# Patient Record
Sex: Male | Born: 1945
Health system: Southern US, Community
[De-identification: ages and names within clinical notes are randomized; demographics above are authoritative.]

## PROBLEM LIST (undated history)

## (undated) DIAGNOSIS — K589 Irritable bowel syndrome without diarrhea: Secondary | ICD-10-CM

## (undated) DIAGNOSIS — Z9889 Other specified postprocedural states: Secondary | ICD-10-CM

## (undated) DIAGNOSIS — K4020 Bilateral inguinal hernia, without obstruction or gangrene, not specified as recurrent: Secondary | ICD-10-CM

## (undated) DIAGNOSIS — Z9852 Vasectomy status: Secondary | ICD-10-CM

## (undated) DIAGNOSIS — K52831 Collagenous colitis: Secondary | ICD-10-CM

## (undated) DIAGNOSIS — Z9079 Acquired absence of other genital organ(s): Secondary | ICD-10-CM

## (undated) DIAGNOSIS — C4492 Squamous cell carcinoma of skin, unspecified: Secondary | ICD-10-CM

## (undated) DIAGNOSIS — N400 Enlarged prostate without lower urinary tract symptoms: Secondary | ICD-10-CM

## (undated) DIAGNOSIS — R972 Elevated prostate specific antigen [PSA]: Secondary | ICD-10-CM

## (undated) DIAGNOSIS — N200 Calculus of kidney: Secondary | ICD-10-CM

## (undated) DIAGNOSIS — N419 Inflammatory disease of prostate, unspecified: Secondary | ICD-10-CM

## (undated) DIAGNOSIS — K219 Gastro-esophageal reflux disease without esophagitis: Secondary | ICD-10-CM

## (undated) HISTORY — DX: Inflammatory disease of prostate, unspecified: N41.9

## (undated) HISTORY — DX: Acquired absence of other genital organ(s): Z90.79

## (undated) HISTORY — DX: Bilateral inguinal hernia, without obstruction or gangrene, not specified as recurrent: K40.20

## (undated) HISTORY — DX: Collagenous colitis: K52.831

## (undated) HISTORY — DX: Gastro-esophageal reflux disease without esophagitis: K21.9

## (undated) HISTORY — DX: Vasectomy status: Z98.52

## (undated) HISTORY — DX: Elevated prostate specific antigen (PSA): R97.20

## (undated) HISTORY — DX: Other specified postprocedural states: Z98.890

## (undated) HISTORY — DX: Calculus of kidney: N20.0

## (undated) HISTORY — DX: Squamous cell carcinoma of skin, unspecified: C44.92

## (undated) HISTORY — DX: Irritable bowel syndrome without diarrhea: K58.9

## (undated) HISTORY — DX: Benign prostatic hyperplasia without lower urinary tract symptoms: N40.0

---

## 1982-12-11 DIAGNOSIS — Z9852 Vasectomy status: Secondary | ICD-10-CM

## 1982-12-11 HISTORY — DX: Vasectomy status: Z98.52

## 2004-01-04 ENCOUNTER — Ambulatory Visit (HOSPITAL_COMMUNITY): Admission: RE | Admit: 2004-01-04 | Discharge: 2004-01-04 | Payer: Self-pay | Admitting: Gastroenterology

## 2004-01-04 ENCOUNTER — Encounter (INDEPENDENT_AMBULATORY_CARE_PROVIDER_SITE_OTHER): Payer: Self-pay | Admitting: *Deleted

## 2008-08-28 ENCOUNTER — Emergency Department (HOSPITAL_COMMUNITY): Admission: EM | Admit: 2008-08-28 | Discharge: 2008-08-29 | Payer: Self-pay | Admitting: Emergency Medicine

## 2010-09-01 ENCOUNTER — Ambulatory Visit (HOSPITAL_COMMUNITY): Admission: RE | Admit: 2010-09-01 | Discharge: 2010-09-01 | Payer: Self-pay | Admitting: Surgery

## 2011-02-23 LAB — SURGICAL PCR SCREEN: Staphylococcus aureus: NEGATIVE

## 2011-04-28 NOTE — Op Note (Signed)
NAME:  AMELIA, MACKEN                     ACCOUNT NO.:  [id number]   MEDICAL RECORD NO.:  [id number]                   PATIENT TYPE:  AMB   LOCATION:  ENDO                                 FACILITY:  [facility]   PHYSICIAN:  Anselmo Rod, M.D.               DATE OF BIRTH:  1946-07-30   DATE OF PROCEDURE:  01/04/2004  DATE OF DISCHARGE:                                 OPERATIVE REPORT   PROCEDURE PERFORMED:  Colonoscopy with biopsy times one.   ENDOSCOPIST:  Charna Elizabeth, M.D.   INSTRUMENT USED:  Olympus video colonoscope.   INDICATIONS FOR PROCEDURE:  The patient is a 65 year old white male with a  history of occasional bright red blood per rectum.  Rule out colonic polyps,  masses, hemorrhoids, etc.   PREPROCEDURE PREPARATION:  Informed consent was procured from the patient.  The patient was fasted for eight hours prior to the procedure and prepped  with a bottle of magnesium citrate and a gallon of GoLYTELY the night prior  to the procedure.   PREPROCEDURE PHYSICAL:  The patient had stable vital signs.  Neck supple.  Chest clear to auscultation.  S1 and S2 regular.  Abdomen soft with normal  bowel sounds.   DESCRIPTION OF PROCEDURE:  The patient was placed in left lateral decubitus  position and sedated with 60 mg of Demerol and 6 mg of Versed intravenously.  Once the patient was adequately sedated and maintained on low flow oxygen  and continuous cardiac monitoring, the Olympus video colonoscope was  advanced from the rectum to the cecum.  There was some residual stool in the  right side of the colon and multiple washes were done.  The patient's  position was changed from the left lateral to the supine and to the right  lateral position with gentle application of abdominal pressure to reach the  cecum.  The appendicular orifice and ileocecal valve were clearly visualized  and photographed.  A small erythematous patch was biopsied from the rectum  at 8 cm.  Retroflexion in  the rectum revealed small internal hemorrhoids.  No other abnormalities were noted.  The patient tolerated the procedure well  without complication.   IMPRESSION:  1. Essentially normal colonoscopy up to the cecum except for small internal     hemorrhoids.  2. Small red patch biopsied at 8 cm.  3. No masses, polyps or diverticulosis seen.   RECOMMENDATIONS:  1. Await pathology results.  2. Repeat colorectal cancer screening depending on pathology results.  3. Avoid all nonsteroidals for now.  4. Outpatient followup as need arises in the future.  5. Continue high fiber diet with liberal fluid intake.                                               Jyothi Nat  Loreta Ave, M.D.    JNM/MEDQ  D:  01/04/2004  T:  01/04/2004  Job:  [id number]   cc:   Marjory Lies, M.D.  [address]  [place]  [place] [address]  Fax: [phone]

## 2011-09-11 LAB — CBC
MCHC: 32.9
Platelets: 146 — ABNORMAL LOW
RBC: 5.23
RDW: 17 — ABNORMAL HIGH

## 2011-09-11 LAB — URINALYSIS, ROUTINE W REFLEX MICROSCOPIC
Nitrite: POSITIVE — AB
Protein, ur: 30 — AB
Specific Gravity, Urine: 1.019
Urobilinogen, UA: 0.2

## 2011-09-11 LAB — DIFFERENTIAL
Basophils Absolute: 0
Basophils Relative: 0
Monocytes Relative: 4
Neutro Abs: 15.7 — ABNORMAL HIGH
Neutrophils Relative %: 94 — ABNORMAL HIGH

## 2011-09-11 LAB — URINE MICROSCOPIC-ADD ON

## 2011-09-11 LAB — POCT I-STAT, CHEM 8
BUN: 20
Chloride: 105
HCT: 42
Sodium: 138

## 2011-09-11 LAB — URINE CULTURE

## 2013-12-11 DIAGNOSIS — Z9889 Other specified postprocedural states: Secondary | ICD-10-CM

## 2013-12-11 HISTORY — DX: Other specified postprocedural states: Z98.890

## 2014-05-29 LAB — HM COLONOSCOPY

## 2014-12-11 DIAGNOSIS — Z9079 Acquired absence of other genital organ(s): Secondary | ICD-10-CM

## 2014-12-11 HISTORY — DX: Acquired absence of other genital organ(s): Z90.79

## 2015-08-10 LAB — PSA: PSA: 11.57

## 2015-09-16 DIAGNOSIS — R69 Illness, unspecified: Secondary | ICD-10-CM | POA: Diagnosis not present

## 2015-09-16 DIAGNOSIS — Z0000 Encounter for general adult medical examination without abnormal findings: Secondary | ICD-10-CM | POA: Diagnosis not present

## 2015-09-16 DIAGNOSIS — R972 Elevated prostate specific antigen [PSA]: Secondary | ICD-10-CM | POA: Diagnosis not present

## 2015-09-21 DIAGNOSIS — R972 Elevated prostate specific antigen [PSA]: Secondary | ICD-10-CM | POA: Diagnosis not present

## 2015-10-07 DIAGNOSIS — N400 Enlarged prostate without lower urinary tract symptoms: Secondary | ICD-10-CM | POA: Diagnosis not present

## 2015-10-11 DIAGNOSIS — R197 Diarrhea, unspecified: Secondary | ICD-10-CM | POA: Diagnosis not present

## 2015-10-14 DIAGNOSIS — N3289 Other specified disorders of bladder: Secondary | ICD-10-CM | POA: Diagnosis not present

## 2015-10-14 DIAGNOSIS — R3914 Feeling of incomplete bladder emptying: Secondary | ICD-10-CM | POA: Diagnosis not present

## 2015-10-14 DIAGNOSIS — C61 Malignant neoplasm of prostate: Secondary | ICD-10-CM | POA: Diagnosis not present

## 2015-10-14 DIAGNOSIS — N401 Enlarged prostate with lower urinary tract symptoms: Secondary | ICD-10-CM | POA: Diagnosis not present

## 2015-10-14 DIAGNOSIS — R69 Illness, unspecified: Secondary | ICD-10-CM | POA: Diagnosis not present

## 2015-10-14 DIAGNOSIS — R339 Retention of urine, unspecified: Secondary | ICD-10-CM | POA: Diagnosis not present

## 2015-10-14 DIAGNOSIS — N359 Urethral stricture, unspecified: Secondary | ICD-10-CM | POA: Diagnosis not present

## 2015-10-14 DIAGNOSIS — N400 Enlarged prostate without lower urinary tract symptoms: Secondary | ICD-10-CM | POA: Diagnosis not present

## 2015-10-14 DIAGNOSIS — N323 Diverticulum of bladder: Secondary | ICD-10-CM | POA: Diagnosis not present

## 2015-10-14 DIAGNOSIS — Z79899 Other long term (current) drug therapy: Secondary | ICD-10-CM | POA: Diagnosis not present

## 2015-10-14 DIAGNOSIS — Z9079 Acquired absence of other genital organ(s): Secondary | ICD-10-CM | POA: Diagnosis not present

## 2015-10-14 DIAGNOSIS — Z9889 Other specified postprocedural states: Secondary | ICD-10-CM | POA: Diagnosis not present

## 2015-10-14 DIAGNOSIS — R197 Diarrhea, unspecified: Secondary | ICD-10-CM | POA: Diagnosis not present

## 2015-11-02 DIAGNOSIS — R972 Elevated prostate specific antigen [PSA]: Secondary | ICD-10-CM | POA: Diagnosis not present

## 2015-11-02 DIAGNOSIS — R8271 Bacteriuria: Secondary | ICD-10-CM | POA: Diagnosis not present

## 2016-01-14 DIAGNOSIS — X32XXXD Exposure to sunlight, subsequent encounter: Secondary | ICD-10-CM | POA: Diagnosis not present

## 2016-01-14 DIAGNOSIS — L570 Actinic keratosis: Secondary | ICD-10-CM | POA: Diagnosis not present

## 2016-01-14 DIAGNOSIS — B351 Tinea unguium: Secondary | ICD-10-CM | POA: Diagnosis not present

## 2016-04-13 DIAGNOSIS — R69 Illness, unspecified: Secondary | ICD-10-CM | POA: Diagnosis not present

## 2016-07-07 ENCOUNTER — Encounter: Payer: Self-pay | Admitting: Internal Medicine

## 2016-07-07 ENCOUNTER — Ambulatory Visit (INDEPENDENT_AMBULATORY_CARE_PROVIDER_SITE_OTHER): Payer: Medicare HMO | Admitting: Internal Medicine

## 2016-07-07 VITALS — BP 132/78 | HR 50 | Temp 97.6°F | Ht 73.0 in | Wt 181.0 lb

## 2016-07-07 DIAGNOSIS — N400 Enlarged prostate without lower urinary tract symptoms: Secondary | ICD-10-CM

## 2016-07-07 DIAGNOSIS — F411 Generalized anxiety disorder: Secondary | ICD-10-CM | POA: Diagnosis not present

## 2016-07-07 DIAGNOSIS — M4120 Other idiopathic scoliosis, site unspecified: Secondary | ICD-10-CM | POA: Diagnosis not present

## 2016-07-07 DIAGNOSIS — R251 Tremor, unspecified: Secondary | ICD-10-CM

## 2016-07-07 DIAGNOSIS — R972 Elevated prostate specific antigen [PSA]: Secondary | ICD-10-CM

## 2016-07-07 DIAGNOSIS — R69 Illness, unspecified: Secondary | ICD-10-CM | POA: Diagnosis not present

## 2016-07-07 DIAGNOSIS — K52831 Collagenous colitis: Secondary | ICD-10-CM | POA: Diagnosis not present

## 2016-07-07 DIAGNOSIS — R252 Cramp and spasm: Secondary | ICD-10-CM

## 2016-07-07 MED ORDER — ZOSTER VACCINE LIVE 19400 UNT/0.65ML ~~LOC~~ SUSR: 0.6500 mL | Freq: Once | SUBCUTANEOUS | 0 refills | Status: AC

## 2016-07-07 MED ORDER — BUDESONIDE 3 MG PO CPEP: 9.0000 mg | ORAL_CAPSULE | Freq: Every day | ORAL | 6 refills | Status: DC

## 2016-07-07 MED ORDER — TETANUS-DIPHTH-ACELL PERTUSSIS 5-2.5-18.5 LF-MCG/0.5 IM SUSP
0.5000 mL | Freq: Once | INTRAMUSCULAR | 0 refills | Status: AC
Start: 2016-07-07 — End: 2016-07-07

## 2016-07-07 NOTE — Patient Instructions (Addendum)
Continue current medications as ordered  Recommend follow up with urology Dr Thomasene Mohair for management of prostate  Follow up in 1-22mos for CPE/ECG/MMSE. Fasting labs prior to appt

## 2016-07-07 NOTE — Progress Notes (Signed)
Patient ID: Bob Larson, male   DOB: 03/02/1946, 70 y.o.   MRN: [id number]    Location:  [facility] Place of Service: OFFICE    Advanced Directive information Does patient have an advance directive?: No, Would patient like information on creating an advanced directive?: No - patient declined information  Chief Complaint  Patient presents with  . Establish Care    new patient to establish care    HPI:  70 yo male seen today as a new pt. His previous PCP is retiring soon. He has intermittent cramping in fingers x 2 mos, unrelated to activity level; has associated numbness in fingers. He is retired from Press photographer and property tax.   BPH/elevated PSA - underwent TURP in Nov 2016 with path results revealing 20% adenoCA. He decided to not to pursue additional mx and has not been back to urology Dr Thomasene Mohair  Collagenous colits - dx in Jun 2015 after colonoscopy bx. No dysplasia or malignancy noted on path report. He was Rx prednisone that caused diarrhea which improved after TURP. In March 2017, diarrhea returned and is using prn budesonide po daily which helps. Has taken pepto bismul and imodium in the past. Has seen Dr Collene Mares  He is taking at least 50 herbal supplements daily.   Anxiety/insomnia - takes 1/2 tab clonazepam qPM.   onychomycosis - applying topical terbinafine (oral caused forced BM with urination)  Past Medical History:  Diagnosis Date  . BPH (benign prostatic hyperplasia)   . Cancer of skin, squamous cell    located on head  . Collagenous colitis   . Elevated PSA   . GERD (gastroesophageal reflux disease)   . H/O colonoscopy 2015   Dr. Collene Mares  . Hernia, inguinal, bilateral   . Hx of vasectomy 1984   Dr. Joelyn Oms  . IBS (irritable bowel syndrome)   . Nephrolith   . Prostatitis   . S/P TURP (status post transurethral resection of prostate) 2016   Dr. Thomasene Mohair    History reviewed. No pertinent surgical history.  Patient Care Team: Gildardo Cranker, DO as PCP -  General (Internal Medicine) Juanita Craver, MD as Consulting Physician (Gastroenterology) Allyn Kenner, MD as Consulting Physician (Dermatology) Rutherford Guys, MD as Consulting Physician (Ophthalmology) Kerry Fort, MD as Consulting Physician (Urology)  Social History   Social History  . Marital status: Married    Spouse name: N/A  . Number of children: N/A  . Years of education: N/A   Occupational History  . Not on file.   Social History Main Topics  . Smoking status: Never Smoker  . Smokeless tobacco: Never Used  . Alcohol use No  . Drug use: No  . Sexual activity: Not on file   Other Topics Concern  . Not on file   Social History Narrative   Diet?  Normal      Do you drink/eat things with caffeine? Yes      Marital status?            M                        What year were you married? 1979      Do you live in a house, apartment, assisted living, condo, trailer, etc.? House      Is it one or more stories? One      How many persons live in your home? 2      Do you have any pets in  your home? (please list) no      Current or past profession: Education officer, museum, Property tax accounting      Do you exercise?          yes                            Type & how often? Bicycling every 4th day      Do you have a living will? no      Do you have a DNR form?      no                            If not, do you want to discuss one? no      Do you have signed POA/HPOA for forms? no           reports that he has never smoked. He has never used smokeless tobacco. He reports that he does not drink alcohol or use drugs.  Family History  Problem Relation Age of Onset  . AAA (abdominal aortic aneurysm) Father 14  . Pneumonia Mother 22  . Heart attack Brother 18   Family Status  Relation Status  . Father Deceased at age 4   AAA  . Mother Deceased at age 30   Pneumonia  . Brother Alive  . Brother Deceased at age 38   Heart Attack  . Daughter Alive  . Daughter Alive     Immunization History  Administered Date(s) Administered  . Tdap 01/09/2005    Allergies  Allergen Reactions  . Penicillins     Medications: Patient's Medications  New Prescriptions   No medications on file  Previous Medications   BUDESONIDE (ENTOCORT EC) 3 MG 24 HR CAPSULE    Take 3 mg by mouth as needed.   CLONAZEPAM (KLONOPIN) 1 MG TABLET    Take 1/2 to 1 tablet by mouth 3 times daily as needed   TERBINAFINE EX    Apply 1,000 mg topically at bedtime.  Modified Medications   No medications on file  Discontinued Medications   BUDESONIDE (ENTOCORT EC) 3 MG 24 HR CAPSULE    Take 9 mg by mouth daily.   CLONAZEPAM (KLONOPIN) 0.5 MG TABLET    Take 0.5 mg by mouth 2 (two) times daily as needed for anxiety.    Review of Systems  Gastrointestinal: Positive for diarrhea.  Musculoskeletal: Positive for arthralgias.  Neurological: Positive for tremors.  Psychiatric/Behavioral: The patient is nervous/anxious.   All other systems reviewed and are negative.   Vitals:   07/07/16 0840  BP: 132/78  Pulse: (!) 50  Temp: 97.6 F (36.4 C)  TempSrc: Oral  SpO2: 98%  Weight: 181 lb (82.1 kg)  Height: 6\' 1"  (1.854 m)   Body mass index is 23.88 kg/m.  Physical Exam  Constitutional: He is oriented to person, place, and time. He appears well-developed and well-nourished.  HENT:  Mouth/Throat: Oropharynx is clear and moist.  Eyes: Pupils are equal, round, and reactive to light. No scleral icterus.  Neck: Neck supple. Carotid bruit is not present.  Cardiovascular: Normal rate, regular rhythm, normal heart sounds and intact distal pulses.  Exam reveals no gallop and no friction rub.   No murmur heard. no distal LE swelling. No calf TTP  Pulmonary/Chest: Effort normal and breath sounds normal. He has no wheezes. He has no rales. He exhibits no tenderness.  Abdominal:  Soft. Bowel sounds are normal. He exhibits no distension, no abdominal bruit, no pulsatile midline mass and no mass.  There is no hepatomegaly. There is no tenderness. There is no rebound and no guarding.  Musculoskeletal: He exhibits edema (min MCP and PIP joint swelling).  Neg Tinel's  Lymphadenopathy:    He has no cervical adenopathy.  Neurological: He is alert and oriented to person, place, and time. He has normal reflexes. He displays tremor (fine resting).  Skin: Skin is warm and dry. No rash noted.  Psychiatric: He has a normal mood and affect. His behavior is normal. Judgment and thought content normal.     Labs reviewed: Abstract on 06/06/2016  Component Date Value Ref Range Status  . HM Colonoscopy 05/29/2014 See Report  See Report, Patient Reported Normal Final  . PSA 08/10/2015 11.57   Final    No results found.   Assessment/Plan   ICD-9-CM ICD-10-CM   1. Hand cramps - probably due to arthritis 729.82 R25.2   2. Idiopathic scoliosis 737.30 M41.20   3. Collagenous colitis 558.9 K52.831 budesonide (ENTOCORT EC) 3 MG 24 hr capsule  4. Tremor of both hands - new;likely benign essential 781.0 R25.1   5. Anxiety state 300.00 F41.1   6. BPH with elevated PSA 600.00 N40.0    790.93 R97.20    Check fasting cmp, lipid panel, tsh and cbc w diff  Observation for tremors  F/u with GI as indicated  F/u with urology for PSA mx  Cont current meds as ordered  F/u in 1-2 mos for CPE/ECG/MMSE  Lillyrose Reitan S. Perlie Gold  [facility] [address] [place], [place] [address] [phone] Cell (Monday-Friday 8 AM - 5 PM) [phone] After 5 PM and follow prompts

## 2016-07-21 ENCOUNTER — Encounter: Payer: Self-pay | Admitting: Internal Medicine

## 2016-07-25 ENCOUNTER — Encounter: Payer: Self-pay | Admitting: Internal Medicine

## 2016-08-03 ENCOUNTER — Other Ambulatory Visit: Payer: Self-pay

## 2016-08-03 DIAGNOSIS — Z0000 Encounter for general adult medical examination without abnormal findings: Secondary | ICD-10-CM

## 2016-08-11 DIAGNOSIS — R972 Elevated prostate specific antigen [PSA]: Secondary | ICD-10-CM | POA: Diagnosis not present

## 2016-08-18 DIAGNOSIS — R972 Elevated prostate specific antigen [PSA]: Secondary | ICD-10-CM | POA: Diagnosis not present

## 2016-08-18 DIAGNOSIS — R339 Retention of urine, unspecified: Secondary | ICD-10-CM | POA: Diagnosis not present

## 2016-08-18 DIAGNOSIS — C61 Malignant neoplasm of prostate: Secondary | ICD-10-CM | POA: Diagnosis not present

## 2016-08-18 DIAGNOSIS — N138 Other obstructive and reflux uropathy: Secondary | ICD-10-CM | POA: Diagnosis not present

## 2016-08-18 DIAGNOSIS — N401 Enlarged prostate with lower urinary tract symptoms: Secondary | ICD-10-CM | POA: Diagnosis not present

## 2016-09-11 ENCOUNTER — Other Ambulatory Visit: Payer: Medicare HMO

## 2016-09-11 DIAGNOSIS — Z0000 Encounter for general adult medical examination without abnormal findings: Secondary | ICD-10-CM

## 2016-09-11 DIAGNOSIS — Z136 Encounter for screening for cardiovascular disorders: Secondary | ICD-10-CM | POA: Diagnosis not present

## 2016-09-11 LAB — COMPLETE METABOLIC PANEL WITH GFR
ALBUMIN: 4.1 g/dL (ref 3.6–5.1)
ALK PHOS: 47 U/L (ref 40–115)
ALT: 16 U/L (ref 9–46)
AST: 21 U/L (ref 10–35)
BUN: 22 mg/dL (ref 7–25)
CALCIUM: 9.1 mg/dL (ref 8.6–10.3)
CO2: 23 mmol/L (ref 20–31)
CREATININE: 1.15 mg/dL (ref 0.70–1.25)
Chloride: 107 mmol/L (ref 98–110)
GFR, EST AFRICAN AMERICAN: 75 mL/min (ref >=60)
GFR, Est Non African American: 65 mL/min (ref >=60)
Glucose, Bld: 81 mg/dL (ref 65–99)
Potassium: 4.4 mmol/L (ref 3.5–5.3)
Sodium: 141 mmol/L (ref 135–146)
Total Bilirubin: 0.7 mg/dL (ref 0.2–1.2)
Total Protein: 6 g/dL — ABNORMAL LOW (ref 6.1–8.1)

## 2016-09-11 LAB — LIPID PANEL
CHOLESTEROL: 193 mg/dL (ref 125–200)
HDL: 50 mg/dL (ref >=40)
LDL Cholesterol: 129 mg/dL (ref <130)
TRIGLYCERIDES: 72 mg/dL (ref <150)
Total CHOL/HDL Ratio: 3.9 Ratio (ref <=5.0)
VLDL: 14 mg/dL (ref <30)

## 2016-09-11 LAB — CBC WITH DIFFERENTIAL/PLATELET
BASOS PCT: 1 %
Basophils Absolute: 68 cells/uL (ref 0–200)
Eosinophils Absolute: 680 cells/uL — ABNORMAL HIGH (ref 15–500)
Eosinophils Relative: 10 %
HEMATOCRIT: 41.6 % (ref 38.5–50.0)
HEMOGLOBIN: 13.5 g/dL (ref 13.2–17.1)
LYMPHS ABS: 1088 {cells}/uL (ref 850–3900)
LYMPHS PCT: 16 %
MCH: 25 pg — ABNORMAL LOW (ref 27.0–33.0)
MCHC: 32.5 g/dL (ref 32.0–36.0)
MCV: 77.2 fL — ABNORMAL LOW (ref 80.0–100.0)
MONO ABS: 340 {cells}/uL (ref 200–950)
MPV: 11.2 fL (ref 7.5–12.5)
Monocytes Relative: 5 %
NEUTROS ABS: 4624 {cells}/uL (ref 1500–7800)
Neutrophils Relative %: 68 %
Platelets: 205 10*3/uL (ref 140–400)
RBC: 5.39 MIL/uL (ref 4.20–5.80)
RDW: 16.4 % — AB (ref 11.0–15.0)
WBC: 6.8 10*3/uL (ref 3.8–10.8)

## 2016-09-11 LAB — TSH: TSH: 3.18 mIU/L (ref 0.40–4.50)

## 2016-09-13 ENCOUNTER — Ambulatory Visit (INDEPENDENT_AMBULATORY_CARE_PROVIDER_SITE_OTHER): Payer: Medicare HMO | Admitting: Internal Medicine

## 2016-09-13 ENCOUNTER — Encounter: Payer: Self-pay | Admitting: Internal Medicine

## 2016-09-13 VITALS — BP 132/84 | HR 60 | Temp 97.8°F | Ht 71.65 in | Wt 185.4 lb

## 2016-09-13 DIAGNOSIS — Z0000 Encounter for general adult medical examination without abnormal findings: Secondary | ICD-10-CM

## 2016-09-13 DIAGNOSIS — N400 Enlarged prostate without lower urinary tract symptoms: Secondary | ICD-10-CM | POA: Diagnosis not present

## 2016-09-13 DIAGNOSIS — I8390 Asymptomatic varicose veins of unspecified lower extremity: Secondary | ICD-10-CM

## 2016-09-13 DIAGNOSIS — R972 Elevated prostate specific antigen [PSA]: Secondary | ICD-10-CM | POA: Diagnosis not present

## 2016-09-13 DIAGNOSIS — K52831 Collagenous colitis: Secondary | ICD-10-CM | POA: Diagnosis not present

## 2016-09-13 NOTE — Progress Notes (Addendum)
Patient ID: ROLLO UPPAL, male   DOB: 21-Mar-1946, 70 y.o.   MRN: [id number]   Location:  [facility]  Place of Service:  OFFICE  Provider: Arletha Grippe, DO  Patient Care Team: Gildardo Cranker, DO as PCP - General (Internal Medicine) Juanita Craver, MD as Consulting Physician (Gastroenterology) Allyn Kenner, MD as Consulting Physician (Dermatology) Rutherford Guys, MD as Consulting Physician (Ophthalmology) Kerry Fort, MD as Consulting Physician (Urology)  Extended Emergency Contact Information Primary Emergency Contact: Meiring,Deborah Address: [place]          [place] [address] [name] of [place] Phone: [phone] Mobile Phone: [phone] Relation: Spouse  Code Status: FULL CODE Goals of Care: Advanced Directive information Advanced Directives 09/13/2016  Does patient have an advance directive? No  Would patient like information on creating an advanced directive? No - patient declined information     Chief Complaint  Patient presents with  . Annual Exam    Yearly Exam  . Flu Vaccine    refused  . MMSE    28/30 passed clock drawing    HPI: Patient is a 70 y.o. male seen in today for an annual wellness exam.  No concerns today  BPH/elevated PSA - underwent TURP in Nov 2016 with path results revealing 20% adenoCA. He decided to not to pursue additional mx. He did see Dr Thomasene Mohair in Sept 2017 and recommended total prostate removal. PSA 8.something. He has no plans to return to urology  Collagenous colits - dx in Jun 2015 after colonoscopy bx. No dysplasia or malignancy noted on path report. He was Rx prednisone that caused diarrhea which improved after TURP. In March 2017, diarrhea returned and is using prn budesonide po daily which helps. Has taken pepto bismul and imodium in the past. Has seen Dr Collene Mares  He is taking at least 50 herbal supplements daily.   Anxiety/insomnia - takes 1/2 tab clonazepam qPM.   onychomycosis - applying topical  terbinafine (oral caused forced BM with urination). He noticed that it is worsening. Followed by dermatology  Depression screen [facility] 2/9 09/13/2016 07/07/2016  Decreased Interest - 0  Down, Depressed, Hopeless 0 0  PHQ - 2 Score 0 0    Fall Risk  09/13/2016 07/07/2016  Falls in the past year? No No   MMSE - Mini Mental State Exam 09/13/2016  Orientation to time 5  Orientation to Place 5  Registration 3  Attention/ Calculation 5  Recall 1  Language- name 2 objects 2  Language- repeat 1  Language- follow 3 step command 3  Language- read & follow direction 1  Write a sentence 1  Copy design 1  Total score 28     Health Maintenance  Topic Date Due  . Hepatitis C Screening  1946-02-23  . ZOSTAVAX  11/14/2006  . PNA vac Low Risk Adult (1 of 2 - PCV13) 11/15/2011  . TETANUS/TDAP  01/09/2015  . INFLUENZA VACCINE  07/11/2016  . COLONOSCOPY  05/29/2024    Urinary incontinence?  None but has nocturia  Functional Status Survey: Is the patient deaf or have difficulty hearing?: No Does the patient have difficulty seeing, even when wearing glasses/contacts?: No Does the patient have difficulty concentrating, remembering, or making decisions?: No Does the patient have difficulty walking or climbing stairs?: No Does the patient have difficulty dressing or bathing?: No Does the patient have difficulty doing errands alone such as visiting a doctor's office or shopping?: No  Exercise?  Yes on a regular basis  Diet? Maintains healthy food choices  Vision Screening Comments: Patient states he has eye doctor, last appointment was May 2016.  Hearing: no issues    Dentition: has dentist  Pain: none  Past Medical History:  Diagnosis Date  . BPH (benign prostatic hyperplasia)   . Cancer of skin, squamous cell    located on head  . Collagenous colitis   . Elevated PSA   . GERD (gastroesophageal reflux disease)   . H/O colonoscopy 2015   Dr. Collene Mares  . Hernia, inguinal, bilateral   .  Hx of vasectomy 1984   Dr. Joelyn Oms  . IBS (irritable bowel syndrome)   . Nephrolith   . Prostatitis   . S/P TURP (status post transurethral resection of prostate) 2016   Dr. Thomasene Mohair    History reviewed. No pertinent surgical history.  Family History  Problem Relation Age of Onset  . AAA (abdominal aortic aneurysm) Father 64  . Pneumonia Mother 56  . Heart attack Brother 23   Family Status  Relation Status  . Father Deceased at age 65   AAA  . Mother Deceased at age 28   Pneumonia  . Brother Alive  . Brother Deceased at age 23   Heart Attack  . Daughter Alive  . Daughter Alive    Social History   Social History  . Marital status: Married    Spouse name: N/A  . Number of children: N/A  . Years of education: N/A   Occupational History  . Not on file.   Social History Main Topics  . Smoking status: Never Smoker  . Smokeless tobacco: Never Used  . Alcohol use No  . Drug use: No  . Sexual activity: Not on file   Other Topics Concern  . Not on file   Social History Narrative   Diet?  Normal      Do you drink/eat things with caffeine? Yes      Marital status?            M                        What year were you married? 1979      Do you live in a house, apartment, assisted living, condo, trailer, etc.? House      Is it one or more stories? One      How many persons live in your home? 2      Do you have any pets in your home? (please list) no      Current or past profession: Education officer, museum, Property tax accounting      Do you exercise?          yes                            Type & how often? Bicycling every 4th day      Do you have a living will? no      Do you have a DNR form?      no                            If not, do you want to discuss one? no      Do you have signed POA/HPOA for forms? no          Allergies  Allergen Reactions  . Penicillins  Medication List       Accurate as of 09/13/16 10:18 PM. Always use your most recent  med list.          budesonide 3 MG 24 hr capsule Commonly known as:  ENTOCORT EC Take 3 capsules (9 mg total) by mouth daily.   clonazePAM 1 MG tablet Commonly known as:  KLONOPIN Take 1/2 to 1 tablet by mouth 3 times daily as needed   TERBINAFINE EX Apply 1,000 mg topically at bedtime.        Review of Systems:  Review of Systems  Constitutional: Negative for activity change, chills and fatigue.  HENT: Negative for sore throat and trouble swallowing.   Eyes: Negative for visual disturbance.  Respiratory: Negative for cough, chest tightness and shortness of breath.   Cardiovascular: Negative for chest pain, palpitations and leg swelling.  Gastrointestinal: Negative for abdominal pain, blood in stool, nausea and vomiting.  Genitourinary: Negative for difficulty urinating, frequency and urgency.  Musculoskeletal: Negative for arthralgias and gait problem.  Skin: Negative for rash.  Neurological: Negative for weakness and headaches.  Psychiatric/Behavioral: Negative for confusion and sleep disturbance. The patient is not nervous/anxious.     Physical Exam: Vitals:   09/13/16 1505  BP: 132/84  Pulse: 60  Temp: 97.8 F (36.6 C)  TempSrc: Oral  SpO2: 98%  Weight: 185 lb 6.4 oz (84.1 kg)  Height: 5' 11.65" (1.82 m)   Body mass index is 25.39 kg/m. Physical Exam  Constitutional: He is oriented to person, place, and time. He appears well-developed and well-nourished. No distress.  HENT:  Head: Normocephalic and atraumatic.  Right Ear: Hearing, tympanic membrane, external ear and ear canal normal.  Left Ear: Hearing, tympanic membrane, external ear and ear canal normal.  Mouth/Throat: Uvula is midline, oropharynx is clear and moist and mucous membranes are normal. He does not have dentures.  Increased cerumen right  Eyes: Conjunctivae, EOM and lids are normal. Pupils are equal, round, and reactive to light. No scleral icterus.  Neck: Trachea normal and normal range of  motion. Neck supple. Carotid bruit is not present. No thyroid mass and no thyromegaly present.  Cardiovascular: Normal rate, regular rhythm, normal heart sounds and intact distal pulses.  Exam reveals no gallop and no friction rub.   No murmur heard. No carotid bruit b/l. No LE edema b/l. No calf TTP. Left distal leg varicose vein, soft, NT no redness. Multiple LE spider veins  Pulmonary/Chest: Effort normal and breath sounds normal. He has no wheezes. He has no rhonchi. He has no rales. Right breast exhibits no inverted nipple, no mass, no nipple discharge, no skin change and no tenderness. Left breast exhibits no inverted nipple, no mass, no nipple discharge, no skin change and no tenderness. Breasts are symmetrical.  Abdominal: Soft. Normal appearance, normal aorta and bowel sounds are normal. He exhibits no pulsatile midline mass and no mass. There is no hepatosplenomegaly. There is no tenderness. There is no rigidity, no rebound and no guarding. No hernia.  Genitourinary:  Genitourinary Comments: Followed by urology  Musculoskeletal: Normal range of motion.  Lymphadenopathy:       Head (right side): No posterior auricular adenopathy present.       Head (left side): No posterior auricular adenopathy present.    He has no cervical adenopathy.       Right: No supraclavicular adenopathy present.       Left: No supraclavicular adenopathy present.  Neurological: He is alert and oriented to person, place,  and time. He has normal strength and normal reflexes. No cranial nerve deficit. Gait normal.  Skin: Skin is warm, dry and intact. No rash noted. Nails show no clubbing.  Left 1st toenail thick and yellow distally. Intact    Psychiatric: He has a normal mood and affect. His speech is normal and behavior is normal. Judgment and thought content normal. Cognition and memory are normal.    Labs reviewed:  Basic Metabolic Panel:  Recent Labs  09/11/16 0802  NA 141  K 4.4  CL 107  CO2 23    GLUCOSE 81  BUN 22  CREATININE 1.15  CALCIUM 9.1  TSH 3.18   Liver Function Tests:  Recent Labs  09/11/16 0802  AST 21  ALT 16  ALKPHOS 47  BILITOT 0.7  PROT 6.0*  ALBUMIN 4.1   No results for input(s): LIPASE, AMYLASE in the last 8760 hours. No results for input(s): AMMONIA in the last 8760 hours. CBC:  Recent Labs  09/11/16 0802  WBC 6.8  NEUTROABS 4,624  HGB 13.5  HCT 41.6  MCV 77.2*  PLT 205   Lipid Panel:  Recent Labs  09/11/16 0802  CHOL 193  HDL 50  LDLCALC 129  TRIG 72  CHOLHDL 3.9   No results found for: HGBA1C  Procedures: No results found.  Assessment/Plan   ICD-9-CM ICD-10-CM   1. Well adult exam V70.0 Z00.00   2. BPH with elevated PSA 600.00 N40.0    790.93 R97.20   3. Collagenous colitis 558.9 K52.831   4.      Varicose vein of leg - reassurance given that they are benign    Pt is UTD on health maintenance. Vaccinations are UTD. He declined both pneumonia vaccines and influenza vaccine. Pt maintains a healthy lifestyle. Encouraged pt to exercise 30-45 minutes 4-5 times per week. Eat a well balanced diet. Avoid smoking. Limit alcohol intake. Wear seatbelt when riding in the car. Wear sun block (SPF >50) when spending extended times outside  Continue current medications as ordered  Follow up with dermatology   Follow up in 1 yr for CPE. Fasting labs prior to appt (cbc w diff, cmp, lipid panel, psa, tsh, ua)   Oceanna Arruda S. Perlie Gold  [facility] [address] [place], [place] [address] [phone] Cell (Monday-Friday 8 AM - 5 PM) [phone] After 5 PM and follow prompts

## 2016-09-13 NOTE — Patient Instructions (Addendum)
Encouraged him to exercise 30-45 minutes 4-5 times per week. Eat a well balanced diet. Avoid smoking. Limit alcohol intake. Wear seatbelt when riding in the car. Wear sun block (SPF >50) when spending extended times outside.   Continue current medications as ordered  Follow up with dermatology   Follow up in 1 yr for CPE

## 2016-09-20 DIAGNOSIS — B351 Tinea unguium: Secondary | ICD-10-CM | POA: Diagnosis not present

## 2016-09-20 DIAGNOSIS — D225 Melanocytic nevi of trunk: Secondary | ICD-10-CM | POA: Diagnosis not present

## 2016-09-20 DIAGNOSIS — X32XXXD Exposure to sunlight, subsequent encounter: Secondary | ICD-10-CM | POA: Diagnosis not present

## 2016-09-20 DIAGNOSIS — L603 Nail dystrophy: Secondary | ICD-10-CM | POA: Diagnosis not present

## 2016-09-20 DIAGNOSIS — L570 Actinic keratosis: Secondary | ICD-10-CM | POA: Diagnosis not present

## 2016-09-21 DIAGNOSIS — Z0000 Encounter for general adult medical examination without abnormal findings: Secondary | ICD-10-CM | POA: Diagnosis not present

## 2016-09-21 DIAGNOSIS — Z6826 Body mass index (BMI) 26.0-26.9, adult: Secondary | ICD-10-CM | POA: Diagnosis not present

## 2016-10-19 DIAGNOSIS — H52203 Unspecified astigmatism, bilateral: Secondary | ICD-10-CM | POA: Diagnosis not present

## 2016-10-19 DIAGNOSIS — H5213 Myopia, bilateral: Secondary | ICD-10-CM | POA: Diagnosis not present

## 2016-10-19 DIAGNOSIS — H524 Presbyopia: Secondary | ICD-10-CM | POA: Diagnosis not present

## 2016-10-19 DIAGNOSIS — H2513 Age-related nuclear cataract, bilateral: Secondary | ICD-10-CM | POA: Diagnosis not present

## 2017-01-10 ENCOUNTER — Telehealth: Payer: Self-pay | Admitting: Internal Medicine

## 2017-01-10 NOTE — Telephone Encounter (Signed)
left msg asking pt to confirm this AWV appt w/ nurse. VDM (DD) °

## 2017-01-11 ENCOUNTER — Other Ambulatory Visit: Payer: Self-pay

## 2017-01-11 DIAGNOSIS — F411 Generalized anxiety disorder: Secondary | ICD-10-CM

## 2017-01-11 DIAGNOSIS — N400 Enlarged prostate without lower urinary tract symptoms: Secondary | ICD-10-CM

## 2017-01-11 DIAGNOSIS — Z0000 Encounter for general adult medical examination without abnormal findings: Secondary | ICD-10-CM

## 2017-01-11 DIAGNOSIS — I8390 Asymptomatic varicose veins of unspecified lower extremity: Secondary | ICD-10-CM | POA: Insufficient documentation

## 2017-01-11 DIAGNOSIS — K52831 Collagenous colitis: Secondary | ICD-10-CM

## 2017-01-11 DIAGNOSIS — R972 Elevated prostate specific antigen [PSA]: Secondary | ICD-10-CM

## 2017-01-17 ENCOUNTER — Ambulatory Visit: Payer: Medicare HMO

## 2017-01-17 DIAGNOSIS — R69 Illness, unspecified: Secondary | ICD-10-CM | POA: Diagnosis not present

## 2017-01-22 DIAGNOSIS — H25013 Cortical age-related cataract, bilateral: Secondary | ICD-10-CM | POA: Diagnosis not present

## 2017-01-22 DIAGNOSIS — H2513 Age-related nuclear cataract, bilateral: Secondary | ICD-10-CM | POA: Diagnosis not present

## 2017-02-07 DIAGNOSIS — H2513 Age-related nuclear cataract, bilateral: Secondary | ICD-10-CM | POA: Diagnosis not present

## 2017-02-14 DIAGNOSIS — H2512 Age-related nuclear cataract, left eye: Secondary | ICD-10-CM | POA: Diagnosis not present

## 2017-02-14 DIAGNOSIS — H52201 Unspecified astigmatism, right eye: Secondary | ICD-10-CM | POA: Diagnosis not present

## 2017-02-14 DIAGNOSIS — H2511 Age-related nuclear cataract, right eye: Secondary | ICD-10-CM | POA: Diagnosis not present

## 2017-02-21 DIAGNOSIS — H52202 Unspecified astigmatism, left eye: Secondary | ICD-10-CM | POA: Diagnosis not present

## 2017-02-21 DIAGNOSIS — H2512 Age-related nuclear cataract, left eye: Secondary | ICD-10-CM | POA: Diagnosis not present

## 2017-04-24 DIAGNOSIS — L570 Actinic keratosis: Secondary | ICD-10-CM | POA: Diagnosis not present

## 2017-04-24 DIAGNOSIS — B351 Tinea unguium: Secondary | ICD-10-CM | POA: Diagnosis not present

## 2017-04-24 DIAGNOSIS — L603 Nail dystrophy: Secondary | ICD-10-CM | POA: Diagnosis not present

## 2017-08-14 DIAGNOSIS — R69 Illness, unspecified: Secondary | ICD-10-CM | POA: Diagnosis not present

## 2017-09-14 ENCOUNTER — Other Ambulatory Visit: Payer: Medicare HMO

## 2017-09-14 DIAGNOSIS — K52831 Collagenous colitis: Secondary | ICD-10-CM | POA: Diagnosis not present

## 2017-09-14 DIAGNOSIS — Z0000 Encounter for general adult medical examination without abnormal findings: Secondary | ICD-10-CM

## 2017-09-14 DIAGNOSIS — Z136 Encounter for screening for cardiovascular disorders: Secondary | ICD-10-CM | POA: Diagnosis not present

## 2017-09-14 DIAGNOSIS — R972 Elevated prostate specific antigen [PSA]: Secondary | ICD-10-CM | POA: Diagnosis not present

## 2017-09-14 DIAGNOSIS — N400 Enlarged prostate without lower urinary tract symptoms: Secondary | ICD-10-CM

## 2017-09-15 LAB — COMPLETE METABOLIC PANEL WITH GFR
AG RATIO: 2.1 (calc) (ref 1.0–2.5)
ALBUMIN MSPROF: 4 g/dL (ref 3.6–5.1)
ALKALINE PHOSPHATASE (APISO): 53 U/L (ref 40–115)
ALT: 16 U/L (ref 9–46)
AST: 18 U/L (ref 10–35)
BUN / CREAT RATIO: 15 (calc) (ref 6–22)
BUN: 18 mg/dL (ref 7–25)
CALCIUM: 9 mg/dL (ref 8.6–10.3)
CO2: 24 mmol/L (ref 20–32)
CREATININE: 1.19 mg/dL — AB (ref 0.70–1.18)
Chloride: 107 mmol/L (ref 98–110)
GFR, EST NON AFRICAN AMERICAN: 62 mL/min/{1.73_m2} (ref >=60)
GFR, Est African American: 71 mL/min/{1.73_m2} (ref >=60)
GLOBULIN: 1.9 g/dL (ref 1.9–3.7)
Glucose, Bld: 84 mg/dL (ref 65–99)
Potassium: 3.7 mmol/L (ref 3.5–5.3)
SODIUM: 141 mmol/L (ref 135–146)
Total Bilirubin: 0.6 mg/dL (ref 0.2–1.2)
Total Protein: 5.9 g/dL — ABNORMAL LOW (ref 6.1–8.1)

## 2017-09-15 LAB — CBC WITH DIFFERENTIAL/PLATELET
BASOS ABS: 48 {cells}/uL (ref 0–200)
Basophils Relative: 0.8 %
EOS PCT: 4.5 %
Eosinophils Absolute: 270 cells/uL (ref 15–500)
HCT: 44.4 % (ref 38.5–50.0)
Hemoglobin: 14.7 g/dL (ref 13.2–17.1)
LYMPHS ABS: 1098 {cells}/uL (ref 850–3900)
MCH: 27.9 pg (ref 27.0–33.0)
MCHC: 33.1 g/dL (ref 32.0–36.0)
MCV: 84.3 fL (ref 80.0–100.0)
MPV: 11.2 fL (ref 7.5–12.5)
Monocytes Relative: 6.7 %
NEUTROS PCT: 69.7 %
Neutro Abs: 4182 cells/uL (ref 1500–7800)
Platelets: 170 10*3/uL (ref 140–400)
RBC: 5.27 10*6/uL (ref 4.20–5.80)
RDW: 13.8 % (ref 11.0–15.0)
TOTAL LYMPHOCYTE: 18.3 %
WBC mixed population: 402 cells/uL (ref 200–950)
WBC: 6 10*3/uL (ref 3.8–10.8)

## 2017-09-15 LAB — URINALYSIS, ROUTINE W REFLEX MICROSCOPIC
BILIRUBIN URINE: NEGATIVE
Glucose, UA: NEGATIVE
HGB URINE DIPSTICK: NEGATIVE
KETONES UR: NEGATIVE
Leukocytes, UA: NEGATIVE
NITRITE: NEGATIVE
Protein, ur: NEGATIVE
Specific Gravity, Urine: 1.019 (ref 1.001–1.03)
pH: 5.5 (ref 5.0–8.0)

## 2017-09-15 LAB — LIPID PANEL
CHOLESTEROL: 186 mg/dL (ref <200)
HDL: 49 mg/dL (ref >40)
LDL CHOLESTEROL (CALC): 120 mg/dL — AB
Non-HDL Cholesterol (Calc): 137 mg/dL (calc) — ABNORMAL HIGH (ref <130)
Total CHOL/HDL Ratio: 3.8 (calc) (ref <5.0)
Triglycerides: 75 mg/dL (ref <150)

## 2017-09-15 LAB — TSH: TSH: 3.16 m[IU]/L (ref 0.40–4.50)

## 2017-09-19 ENCOUNTER — Encounter: Payer: Self-pay | Admitting: Internal Medicine

## 2017-09-19 ENCOUNTER — Ambulatory Visit (INDEPENDENT_AMBULATORY_CARE_PROVIDER_SITE_OTHER): Payer: Medicare HMO | Admitting: Internal Medicine

## 2017-09-19 VITALS — BP 130/78 | HR 51 | Temp 97.7°F | Resp 12 | Ht 71.0 in | Wt 178.0 lb

## 2017-09-19 DIAGNOSIS — K52831 Collagenous colitis: Secondary | ICD-10-CM

## 2017-09-19 DIAGNOSIS — F411 Generalized anxiety disorder: Secondary | ICD-10-CM

## 2017-09-19 DIAGNOSIS — B351 Tinea unguium: Secondary | ICD-10-CM

## 2017-09-19 DIAGNOSIS — N400 Enlarged prostate without lower urinary tract symptoms: Secondary | ICD-10-CM | POA: Diagnosis not present

## 2017-09-19 DIAGNOSIS — H6121 Impacted cerumen, right ear: Secondary | ICD-10-CM

## 2017-09-19 DIAGNOSIS — Z79899 Other long term (current) drug therapy: Secondary | ICD-10-CM | POA: Diagnosis not present

## 2017-09-19 DIAGNOSIS — R69 Illness, unspecified: Secondary | ICD-10-CM | POA: Diagnosis not present

## 2017-09-19 DIAGNOSIS — M4125 Other idiopathic scoliosis, thoracolumbar region: Secondary | ICD-10-CM

## 2017-09-19 DIAGNOSIS — R972 Elevated prostate specific antigen [PSA]: Secondary | ICD-10-CM | POA: Diagnosis not present

## 2017-09-19 DIAGNOSIS — Z0000 Encounter for general adult medical examination without abnormal findings: Secondary | ICD-10-CM

## 2017-09-19 MED ORDER — CLONAZEPAM 1 MG PO TABS: ORAL_TABLET | ORAL | 3 refills | Status: DC

## 2017-09-19 MED ORDER — CLONAZEPAM 1 MG PO TABS: ORAL_TABLET | ORAL | 1 refills | Status: AC

## 2017-09-19 NOTE — Patient Instructions (Addendum)
Right ear lavage done today  Will call with bone density results  Continue current medications as ordered  Resume topical antifungal to left big toenail only  Follow up in 1 yr CPE/MMSE  Keeping you healthy  Get these tests  Blood pressure- Have your blood pressure checked once a year by your healthcare provider.  Normal blood pressure is 120/80  Weight- Have your body mass index (BMI) calculated to screen for obesity.  BMI is a measure of body fat based on height and weight. You can also calculate your own BMI at [email or web address].  Cholesterol- Have your cholesterol checked every year.  Diabetes- Have your blood sugar checked regularly if you have high blood pressure, high cholesterol, have a family history of diabetes or if you are overweight.  Screening for Colon Cancer- Colonoscopy starting at age 28.  Screening may begin sooner depending on your family history and other health conditions. Follow up colonoscopy as directed by your Gastroenterologist.  Screening for Prostate Cancer- Both blood work (PSA) and a rectal exam help screen for Prostate Cancer.  Screening begins at age 39 with African-American men and at age 46 with Caucasian men.  Screening may begin sooner depending on your family history.  Take these medicines  Aspirin- One aspirin daily can help prevent Heart disease and Stroke.  Flu shot- Every fall.  Tetanus- Every 10 years.  Zostavax- Once after the age of 33 to prevent Shingles.  Pneumonia shot- Once after the age of 14; if you are younger than 78, ask your healthcare provider if you need a Pneumonia shot.  Take these steps  Don't smoke- If you do smoke, talk to your doctor about quitting.  For tips on how to quit, go to www.smokefree.gov or call 1-800-QUIT-NOW.  Be physically active- Exercise 5 days a week for at least 30 minutes.  If you are not already physically active start slow and gradually work up to 30 minutes of moderate physical  activity.  Examples of moderate activity include walking briskly, mowing the yard, dancing, swimming, bicycling, etc.  Eat a healthy diet- Eat a variety of healthy food such as fruits, vegetables, low fat milk, low fat cheese, yogurt, lean meant, poultry, fish, beans, tofu, etc. For more information go to www.thenutritionsource.org  Drink alcohol in moderation- Limit alcohol intake to less than two drinks a day. Never drink and drive.  Dentist- Brush and floss twice daily; visit your dentist twice a year.  Depression- Your emotional health is as important as your physical health. If you're feeling down, or losing interest in things you would normally enjoy please talk to your healthcare provider.  Eye exam- Visit your eye doctor every year.  Safe sex- If you may be exposed to a sexually transmitted infection, use a condom.  Seat belts- Seat belts can save your life; always wear one.  Smoke/Carbon Monoxide detectors- These detectors need to be installed on the appropriate level of your home.  Replace batteries at least once a year.  Skin cancer- When out in the sun, cover up and use sunscreen 15 SPF or higher.  Violence- If anyone is threatening you, please tell your healthcare provider.  Living Will/ Health care power of attorney- Speak with your healthcare provider and family.

## 2017-09-19 NOTE — Progress Notes (Signed)
Patient ID: Bob Larson, male   DOB: 10-Nov-1946, 71 y.o.   MRN: [id number]   Location:  [facility]  Place of Service:  OFFICE  Provider: Arletha Grippe, DO  Patient Care Team: Gildardo Cranker, DO as PCP - General (Internal Medicine) Juanita Craver, MD as Consulting Physician (Gastroenterology) Allyn Kenner, MD as Consulting Physician (Dermatology) Rutherford Guys, MD as Consulting Physician (Ophthalmology) Bob Larson Verner Chol., MD as Consulting Physician (Urology)  Extended Emergency Contact Information Primary Emergency Contact: Bob Larson Address: [address]          [place] [address] [name] Phone: [phone] Mobile Phone: [phone] Relation: Spouse  Code Status: FULL CODE Goals of Care: Advanced Directive information Advanced Directives 09/13/2016  Does Patient Have a Medical Advance Directive? No  Would patient like information on creating a medical advance directive? No - patient declined information     Chief Complaint  Patient presents with  . Annual Exam    Yearly check-up, MMSE (28/30, passed clock drawing), follow-up on Chron's and left big toe(change in appearance)   . Medication Refill    Clonazepam   . Immunizations    TDaP is up to date, refused all other vaccine reccomendations  . Health Maintenance    Refused Hep C screening     HPI: Patient is a 71 y.o. male seen in today for an annual wellness exam.  He had cats OU by Bob Larson in March 2018. No complications.  BPH/elevated PSA/LUTS - underwent TURP in Nov 2016 with path results revealing 20% adenoCA. He decided to not to pursue additional mx. He did see Bob Larson in Sept 2017 and recommended total prostate removal. PSA 8.99. He has no plans to return to urology. No nocturia, increased urinary frequency/urgency.  Collagenous colits - dx in Jun 2015 after colonoscopy bx. No dysplasia or malignancy noted on path report. He was Rx prednisone that caused diarrhea  which improved after TURP. In March 2017, diarrhea returned and is using prn budesonide po daily which helps. Has taken pepto bismul and imodium in the past. Has seen Bob Larson. He noticed more "gurgling" of bowel when he changes positions and has loose stool but no overt diarrhea.  He is taking at least 50 herbal supplements daily.   Anxiety/insomnia - stable. He takes 1/2 tab clonazepam qPM.   onychomycosis - he stopped applying topical terbinafine (oral caused forced BM with urination). He states left toe "looks funny". He admitted that he attempted to apply the topical agent under the toenail per instructions of his pharmacist. The skin began to peel and he stopped using agent. Followed by dermatology Bob Larson  Depression screen [facility] 2/9 09/19/2017 09/13/2016 07/07/2016  Decreased Interest 0 - 0  Down, Depressed, Hopeless 0 0 0  PHQ - 2 Score 0 0 0    Fall Risk  09/19/2017 09/13/2016 07/07/2016  Falls in the past year? No No No   MMSE - Mini Mental State Exam 09/19/2017 09/13/2016  Orientation to time 5 5  Orientation to Place 5 5  Registration 3 3  Attention/ Calculation 5 5  Recall 1 1  Language- name 2 objects 2 2  Language- repeat 1 1  Language- follow 3 step command 3 3  Language- read & follow direction 1 1  Write a sentence 1 1  Copy design 1 1  Total score 28 28     Health Maintenance  Topic Date Due  . INFLUENZA VACCINE  12/11/2018 (Originally 07/11/2017)  .  Hepatitis C Screening  12/11/2018 (Originally [birth year])  . PNA vac Low Risk Adult (1 of 2 - PCV13) 12/11/2018 (Originally 11/15/2011)  . COLONOSCOPY  05/29/2024  . TETANUS/TDAP  07/11/2026    Urinary incontinence?  Functional Status Survey: Is the patient deaf or have difficulty hearing?: No Does the patient have difficulty seeing, even when wearing glasses/contacts?: No Does the patient have difficulty concentrating, remembering, or making decisions?: No Does the patient have difficulty walking or climbing  stairs?: No Does the patient have difficulty dressing or bathing?: No Does the patient have difficulty doing errands alone such as visiting a doctor's office or shopping?: No  Exercise? He has a regular routine  Diet? Makes healthy food choices  No exam data present  Hearing: no concerns    Dentition: sees dentist  Pain: he has intermittent back pain  Past Medical History:  Diagnosis Date  . BPH (benign prostatic hyperplasia)   . Cancer of skin, squamous cell    located on head  . Collagenous colitis   . Elevated PSA   . GERD (gastroesophageal reflux disease)   . H/O colonoscopy 2015   Bob. Collene Larson  . Hernia, inguinal, bilateral   . Hx of vasectomy 1984   Bob. Joelyn Larson  . IBS (irritable bowel syndrome)   . Nephrolith   . Prostatitis   . S/P TURP (status post transurethral resection of prostate) 2016   Bob. Thomasene Larson    History reviewed. No pertinent surgical history.  Family History  Problem Relation Age of Onset  . AAA (abdominal aortic aneurysm) Father 29  . Pneumonia Mother 8  . Kidney failure Brother   . Pneumonia Brother   . Heart attack Brother 53   Family Status  Relation Status  . Father Deceased at age 35       AAA  . Mother Deceased at age 84       Pneumonia  . Brother Deceased  . Brother Deceased at age 1       Heart Attack  . Daughter Alive  . Daughter Alive    Social History   Social History  . Marital status: Married    Spouse name: N/A  . Number of children: N/A  . Years of education: N/A   Occupational History  . Not on file.   Social History Main Topics  . Smoking status: Never Smoker  . Smokeless tobacco: Never Used  . Alcohol use No  . Drug use: No  . Sexual activity: Not on file   Other Topics Concern  . Not on file   Social History Narrative   Diet?  Normal      Do you drink/eat things with caffeine? Yes      Marital status?            M                        What year were you married? 1979      Do you live in a  house, apartment, assisted living, condo, trailer, etc.? House      Is it one or more stories? One      How many persons live in your home? 2      Do you have any pets in your home? (please list) no      Current or past profession: Education officer, museum, Property tax accounting      Do you exercise?  yes                            Type & how often? Bicycling every 4th day      Do you have a living will? no      Do you have a DNR form?      no                            If not, do you want to discuss one? no      Do you have signed POA/HPOA for forms? no          Allergies  Allergen Reactions  . Penicillins     Allergies as of 09/19/2017      Reactions   Penicillins       Medication List       Accurate as of 09/19/17  3:28 PM. Always use your most recent med list.          budesonide 3 MG 24 hr capsule Commonly known as:  ENTOCORT EC Take 9 mg by mouth as needed.   clonazePAM 1 MG tablet Commonly known as:  KLONOPIN Take 1/2 to 1 tablet by mouth 3 times daily as needed        Review of Systems:  Review of Systems  Skin:       Left 1st toenail discoloration  All other systems reviewed and are negative.   Physical Exam: Vitals:   09/19/17 1459  BP: 130/78  Pulse: (!) 51  Resp: 12  Temp: 97.7 F (36.5 C)  TempSrc: Oral  SpO2: 98%  Weight: 178 lb (80.7 kg)  Height: 5\' 11"  (1.803 m)   Body mass index is 24.83 kg/m. Physical Exam  Constitutional: He is oriented to person, place, and time. He appears well-developed and well-nourished. No distress.  HENT:  Head: Normocephalic and atraumatic.  Right Ear: External ear normal.  Left Ear: External ear normal.  Mouth/Throat: Oropharynx is clear and moist. No oropharyngeal exudate.  Right cerumen impaction  Eyes: Pupils are equal, round, and reactive to light. EOM are normal. No scleral icterus.  Neck: Normal range of motion. Neck supple. Carotid bruit is not present. No tracheal deviation present.  No thyromegaly present.  Cardiovascular: Normal rate, regular rhythm and intact distal pulses.  Exam reveals no gallop and no friction rub.   No murmur heard. No LE edema b/l. No calf TTP  Pulmonary/Chest: Effort normal and breath sounds normal. No respiratory distress. He has no wheezes. He has no rales. He exhibits no tenderness.  No rhonchi  Abdominal: Soft. Bowel sounds are normal. He exhibits no distension and no mass. There is no hepatosplenomegaly or hepatomegaly. There is no tenderness. There is no rebound and no guarding. No hernia.  Genitourinary: Rectum normal, prostate normal and penis normal. Rectal exam shows guaiac negative stool.  Musculoskeletal: He exhibits edema, tenderness and deformity (thoracolumbar scoliosis with kyphosis).  Lymphadenopathy:    He has no cervical adenopathy.  Neurological: He is alert and oriented to person, place, and time. He has normal reflexes.  Skin: Skin is warm and dry. No rash noted.  Left 1st toenail yellow and split distally, thick  Psychiatric: He has a normal mood and affect. His behavior is normal. Judgment and thought content normal.  Vitals reviewed.   Labs reviewed:  Basic Metabolic Panel:  Recent Labs  09/14/17 0815  NA 141  K 3.7  CL 107  CO2 24  GLUCOSE 84  BUN 18  CREATININE 1.19*  CALCIUM 9.0  TSH 3.16   Liver Function Tests:  Recent Labs  09/14/17 0815  AST 18  ALT 16  BILITOT 0.6  PROT 5.9*   No results for input(s): LIPASE, AMYLASE in the last 8760 hours. No results for input(s): AMMONIA in the last 8760 hours. CBC:  Recent Labs  09/14/17 0815  WBC 6.0  NEUTROABS 4,182  HGB 14.7  HCT 44.4  MCV 84.3  PLT 170   Lipid Panel:  Recent Labs  09/14/17 0815  CHOL 186  HDL 49  TRIG 75  CHOLHDL 3.8   No results found for: HGBA1C  Procedures: No results found.  Assessment/Plan   ICD-10-CM   1. Medicare annual wellness visit, subsequent Z00.00   2. Hearing loss of right ear due to cerumen  impaction H61.21   3. Other idiopathic scoliosis, thoracolumbar region M41.25 DG Bone Density  4. BPH with elevated PSA N40.0    R97.20   5. Collagenous colitis K52.831   6. Onychomycosis of left great toe B35.1   7. Anxiety state F41.1 clonazePAM (KLONOPIN) 1 MG tablet    DISCONTINUED: clonazePAM (KLONOPIN) 1 MG tablet  8. Long-term current use of high risk medication other than anticoagulant Z79.899 DG Bone Density   glucocorticoid   Right ear lavage done today using forceps and curette with minimum success. Recommended he see ENT but he declined referral today  Will call with bone density results  Continue current medications as ordered  Resume topical antifungal to left big toenail only  He declined influenza vaccine today as well as other HM vaccines  Follow up in 1 yr CPE/MMSE  Keeping you healthy handout given    Cordella Register. Perlie Gold  [facility] [address] [place], [place] [address] [phone] Cell (Monday-Friday 8 AM - 5 PM) [phone] After 5 PM and follow prompts

## 2017-09-20 ENCOUNTER — Telehealth: Payer: Self-pay | Admitting: *Deleted

## 2017-09-20 DIAGNOSIS — H6121 Impacted cerumen, right ear: Secondary | ICD-10-CM

## 2017-09-20 NOTE — Telephone Encounter (Signed)
Patient called this morning and stated that he was seen by Dr. Eulas Post yesterday and an ear lavage was performed. Patient stated that his hearing is now worse than before. He stated that Dr. Eulas Post was going to refer him to a ENT dr due to not being able to get all of the wax out of his ear. Patient is wanting to know who the ENT provider will be, wants appointment ASAP.  No referral is in patient's last OV note as being placed. Please Advise.

## 2017-09-20 NOTE — Telephone Encounter (Signed)
Refer to ENT for right cerumen impaction with hearing loss; pt declined at [facility] yesterday

## 2017-09-20 NOTE — Telephone Encounter (Signed)
Referral placed.

## 2017-09-27 ENCOUNTER — Telehealth: Payer: Self-pay

## 2017-09-27 NOTE — Telephone Encounter (Signed)
Left message on voice mail, made appt with [facility], [place] [address]; [phone] for Monday 10/01/17 at 2:30 with PA Ellyn Hack. If he is unable to keep this appt, he can call their office repeated phone number twice.

## 2017-11-15 DIAGNOSIS — Z961 Presence of intraocular lens: Secondary | ICD-10-CM | POA: Diagnosis not present

## 2017-11-15 DIAGNOSIS — H524 Presbyopia: Secondary | ICD-10-CM | POA: Diagnosis not present

## 2017-11-15 DIAGNOSIS — H52203 Unspecified astigmatism, bilateral: Secondary | ICD-10-CM | POA: Diagnosis not present

## 2017-11-15 DIAGNOSIS — Z0101 Encounter for examination of eyes and vision with abnormal findings: Secondary | ICD-10-CM | POA: Diagnosis not present

## 2017-11-15 DIAGNOSIS — H5213 Myopia, bilateral: Secondary | ICD-10-CM | POA: Diagnosis not present

## 2017-12-27 DIAGNOSIS — B351 Tinea unguium: Secondary | ICD-10-CM | POA: Diagnosis not present

## 2017-12-27 DIAGNOSIS — M7752 Other enthesopathy of left foot: Secondary | ICD-10-CM | POA: Diagnosis not present

## 2017-12-27 DIAGNOSIS — M216X1 Other acquired deformities of right foot: Secondary | ICD-10-CM | POA: Diagnosis not present

## 2018-01-21 ENCOUNTER — Ambulatory Visit: Payer: Medicare HMO | Admitting: Nurse Practitioner

## 2018-02-05 DIAGNOSIS — Z6824 Body mass index (BMI) 24.0-24.9, adult: Secondary | ICD-10-CM | POA: Diagnosis not present

## 2018-02-05 DIAGNOSIS — R634 Abnormal weight loss: Secondary | ICD-10-CM | POA: Diagnosis not present

## 2018-02-05 DIAGNOSIS — Z1389 Encounter for screening for other disorder: Secondary | ICD-10-CM | POA: Diagnosis not present

## 2018-02-05 DIAGNOSIS — R972 Elevated prostate specific antigen [PSA]: Secondary | ICD-10-CM | POA: Diagnosis not present

## 2018-02-05 DIAGNOSIS — C61 Malignant neoplasm of prostate: Secondary | ICD-10-CM | POA: Diagnosis not present

## 2018-02-05 DIAGNOSIS — R5383 Other fatigue: Secondary | ICD-10-CM | POA: Diagnosis not present

## 2018-02-05 DIAGNOSIS — R030 Elevated blood-pressure reading, without diagnosis of hypertension: Secondary | ICD-10-CM | POA: Diagnosis not present

## 2018-02-05 DIAGNOSIS — R194 Change in bowel habit: Secondary | ICD-10-CM | POA: Diagnosis not present

## 2018-02-05 DIAGNOSIS — R69 Illness, unspecified: Secondary | ICD-10-CM | POA: Diagnosis not present

## 2018-02-05 DIAGNOSIS — H6121 Impacted cerumen, right ear: Secondary | ICD-10-CM | POA: Diagnosis not present

## 2018-02-18 ENCOUNTER — Other Ambulatory Visit: Payer: Self-pay | Admitting: Internal Medicine

## 2018-02-18 DIAGNOSIS — R972 Elevated prostate specific antigen [PSA]: Secondary | ICD-10-CM

## 2018-02-18 DIAGNOSIS — C61 Malignant neoplasm of prostate: Secondary | ICD-10-CM

## 2018-02-21 DIAGNOSIS — R69 Illness, unspecified: Secondary | ICD-10-CM | POA: Diagnosis not present

## 2018-03-01 ENCOUNTER — Ambulatory Visit
Admission: RE | Admit: 2018-03-01 | Discharge: 2018-03-01 | Disposition: A | Discharge status: Home / Self Care | Payer: Medicare HMO | Source: Ambulatory Visit | Attending: Internal Medicine | Admitting: Internal Medicine

## 2018-03-01 DIAGNOSIS — C61 Malignant neoplasm of prostate: Secondary | ICD-10-CM | POA: Diagnosis not present

## 2018-03-01 DIAGNOSIS — R972 Elevated prostate specific antigen [PSA]: Secondary | ICD-10-CM

## 2018-03-01 MED ORDER — GADOBENATE DIMEGLUMINE 529 MG/ML IV SOLN
17.0000 mL | Freq: Once | INTRAVENOUS | Status: AC | PRN
  Administered 2018-03-01: 17 mL via INTRAVENOUS

## 2018-03-14 DIAGNOSIS — H900 Conductive hearing loss, bilateral: Secondary | ICD-10-CM | POA: Diagnosis not present

## 2018-03-14 DIAGNOSIS — H6123 Impacted cerumen, bilateral: Secondary | ICD-10-CM | POA: Diagnosis not present

## 2018-04-07 IMAGING — MR MR PROSTATE WO/W CM
56 series · 56 of 56 positions shown · IV contrast (Multihance 17ml)
Comparison: None

CLINICAL DATA: Prostate cancer, Gleason score 7 discovered at TURP.
Negative transrectal biopsy.

EXAM:
MR PROSTATE WITHOUT AND WITH CONTRAST
TECHNIQUE: Multiplanar multisequence MRI images were obtained of the pelvis
centered about the prostate. Pre and post contrast images were
obtained.
CONTRAST:  17mL MULTIHANCE GADOBENATE DIMEGLUMINE 529 MG/ML IV SOLN
Creatinine was obtained on site at [HOSPITAL] at [HOSPITAL].
Results: Creatinine 1.1 mg/dL.

[Series 2: new loc · axial · 6.0mm · 0.88mm/px · 1 of 17 slices shown]
[im 1/17]
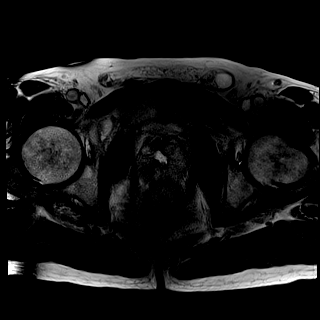

[Series 3: T1 · axial · 8.0mm · 1.06mm/px · 1 of 28 slices shown (1 of 2)]
[im 1/28]
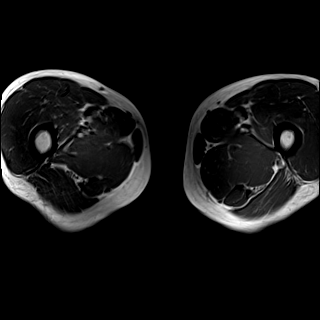

[Series 4: bSSFP fat-sat · axial · 8.0mm · 0.74mm/px · 1 of 28 slices shown]
[im 1/28]
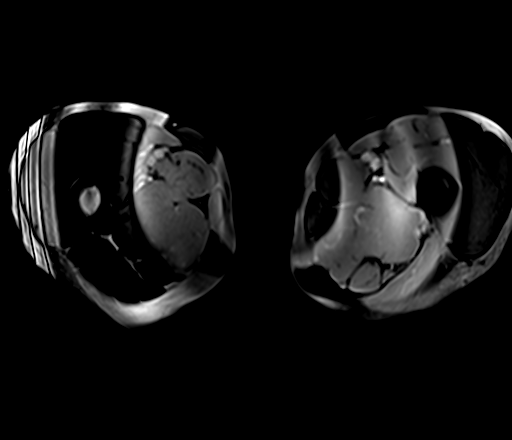

[Series 5: T2 · sagittal · 3.5mm · 0.56mm/px · 1 of 39 slices shown (1 of 4)]
[im 1/39]
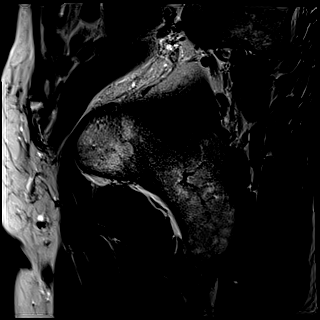

[Series 6: T1 · axial · 3.0mm · 0.31mm/px · 1 of 24 slices shown (2 of 2)]
[im 1/24]
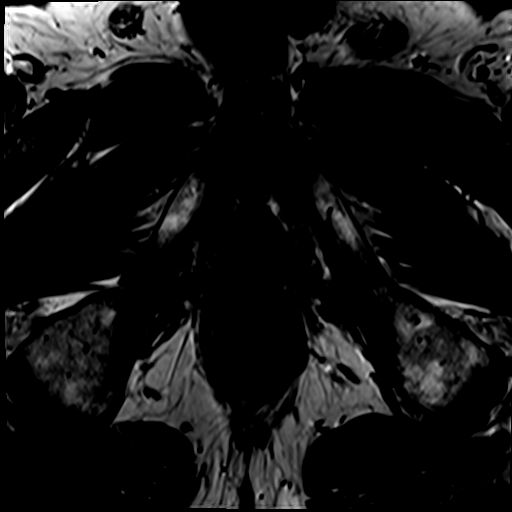

[Series 7: T2 · axial · 3.5mm · 0.56mm/px · 1 of 23 slices shown (2 of 4)]
[im 1/23]
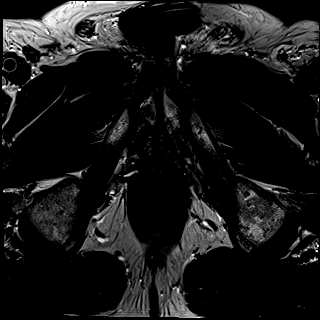

[Series 8: T2 · axial · 1.0mm · 1.04mm/px · 1 of 80 slices shown (3 of 4)]
[im 1/80]
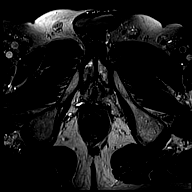

[Series 9: T2 · coronal · 3.5mm · 0.56mm/px · 1 of 23 slices shown (4 of 4)]
[im 1/23]
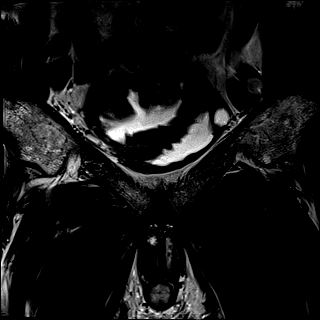

[Series 10: DWI · axial · 3.5mm · 1.56mm/px · 1 of 60 slices shown (1 of 2)]
[im 1/60]
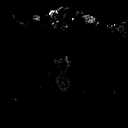

[Series 11: DWI · axial · 3.5mm · 1.56mm/px · 1 of 20 slices shown (2 of 2)]
[im 1/20]
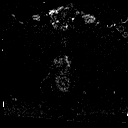

[Series 12: pre t1_twist_tra_dyn_ttc=5.3s · axial · non-contrast · 3.5mm · 0.83mm/px · 1 of 20 slices shown]
[im 1/20]
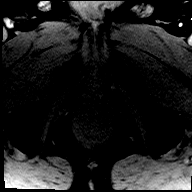

[Series 13: post t1_twist_tra_dyn-copy center · axial · 3.5mm · 0.83mm/px · 1 of 20 slices shown (1 of 23)]
[im 1/20]
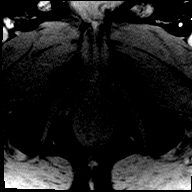

[Series 14: post t1_twist_tra_dyn-copy center · axial · 3.5mm · 0.83mm/px · 1 of 20 slices shown (2 of 23)]
[im 1/20]
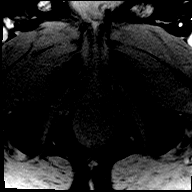

[Series 15: post t1_twist_tra_dyn-copy cent_sub_ttc=23.0s · axial · 3.5mm · 0.83mm/px · 1 of 20 slices shown]
[im 1/20]
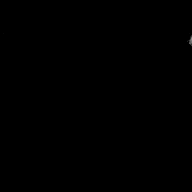

[Series 16: post t1_twist_tra_dyn-copy center · axial · 3.5mm · 0.83mm/px · 1 of 20 slices shown (3 of 23)]
[im 1/20]
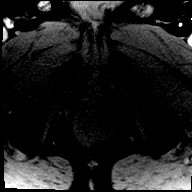

[Series 17: post t1_twist_tra_dyn-copy cent_sub_ttc=33.2s · axial · 3.5mm · 0.83mm/px · 1 of 20 slices shown]
[im 1/20]
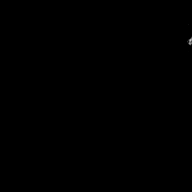

[Series 18: post t1_twist_tra_dyn-copy center · axial · 3.5mm · 0.83mm/px · 1 of 20 slices shown (4 of 23)]
[im 1/20]
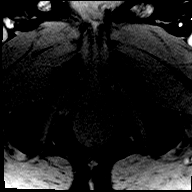

[Series 19: post t1_twist_tra_dyn-copy cent_sub_ttc=43.4s · axial · 3.5mm · 0.83mm/px · 1 of 20 slices shown]
[im 1/20]
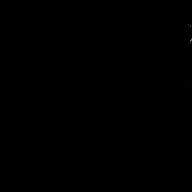

[Series 20: post t1_twist_tra_dyn-copy center · axial · 3.5mm · 0.83mm/px · 1 of 20 slices shown (5 of 23)]
[im 1/20]
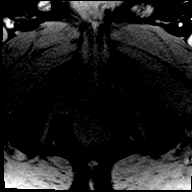

[Series 21: post t1_twist_tra_dyn-copy cent_sub_ttc=53.6s · axial · 3.5mm · 0.83mm/px · 1 of 20 slices shown]
[im 1/20]
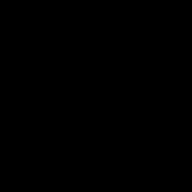

[Series 22: post t1_twist_tra_dyn-copy center · axial · 3.5mm · 0.83mm/px · 1 of 20 slices shown (6 of 23)]
[im 1/20]
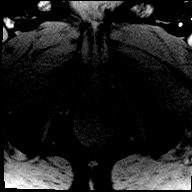

[Series 23: post t1_twist_tra_dyn-copy cent_sub_ttc=63.8s · axial · 3.5mm · 0.83mm/px · 1 of 20 slices shown]
[im 1/20]
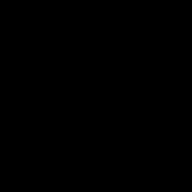

[Series 24: post t1_twist_tra_dyn-copy center · axial · 3.5mm · 0.83mm/px · 1 of 20 slices shown (7 of 23)]
[im 1/20]
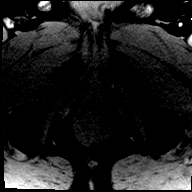

[Series 25: post t1_twist_tra_dyn-copy cent_sub_ttc=74.0s · axial · 3.5mm · 0.83mm/px · 1 of 20 slices shown]
[im 1/20]
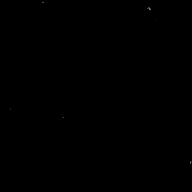

[Series 26: post t1_twist_tra_dyn-copy center · axial · 3.5mm · 0.83mm/px · 1 of 20 slices shown (8 of 23)]
[im 1/20]
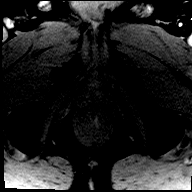

[Series 27: post t1_twist_tra_dyn-copy cent_sub_ttc=84.2s · axial · 3.5mm · 0.83mm/px · 1 of 20 slices shown]
[im 1/20]
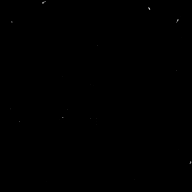

[Series 28: post t1_twist_tra_dyn-copy center · axial · 3.5mm · 0.83mm/px · 1 of 20 slices shown (9 of 23)]
[im 1/20]
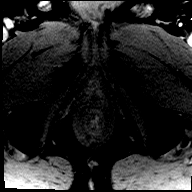

[Series 29: post t1_twist_tra_dyn-copy cent_sub_ttc=94.4s · axial · 3.5mm · 0.83mm/px · 1 of 20 slices shown]
[im 1/20]
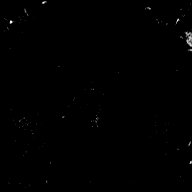

[Series 30: post t1_twist_tra_dyn-copy center · axial · 3.5mm · 0.83mm/px · 1 of 20 slices shown (10 of 23)]
[im 1/20]
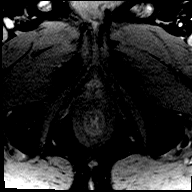

[Series 31: post t1_twist_tra_dyn-copy cent_sub_ttc=104.7s · axial · 3.5mm · 0.83mm/px · 1 of 20 slices shown]
[im 1/20]
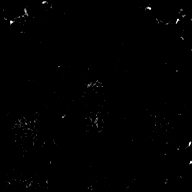

[Series 32: post t1_twist_tra_dyn-copy center · axial · 3.5mm · 0.83mm/px · 1 of 20 slices shown (11 of 23)]
[im 1/20]
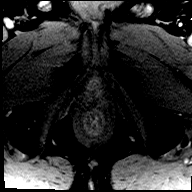

[Series 33: post t1_twist_tra_dyn-copy cent_sub_ttc=114.9s · axial · 3.5mm · 0.83mm/px · 1 of 20 slices shown]
[im 1/20]
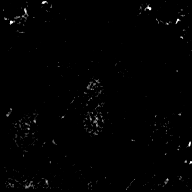

[Series 34: post t1_twist_tra_dyn-copy center · axial · 3.5mm · 0.83mm/px · 1 of 20 slices shown (12 of 23)]
[im 1/20]
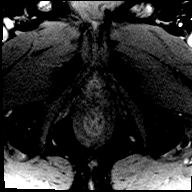

[Series 35: post t1_twist_tra_dyn-copy cent_sub_ttc=125.1s · axial · 3.5mm · 0.83mm/px · 1 of 20 slices shown]
[im 1/20]
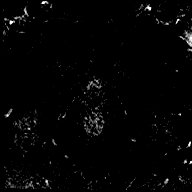

[Series 36: post t1_twist_tra_dyn-copy center · axial · 3.5mm · 0.83mm/px · 1 of 20 slices shown (13 of 23)]
[im 1/20]
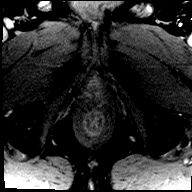

[Series 37: post t1_twist_tra_dyn-copy cent_sub_ttc=135.3s · axial · 3.5mm · 0.83mm/px · 1 of 20 slices shown]
[im 1/20]
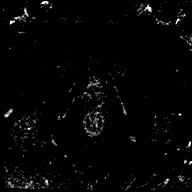

[Series 38: post t1_twist_tra_dyn-copy center · axial · 3.5mm · 0.83mm/px · 1 of 20 slices shown (14 of 23)]
[im 1/20]
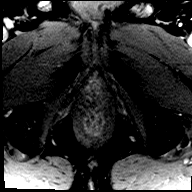

[Series 39: post t1_twist_tra_dyn-copy cent_sub_ttc=145.5s · axial · 3.5mm · 0.83mm/px · 1 of 20 slices shown]
[im 1/20]
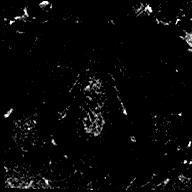

[Series 40: post t1_twist_tra_dyn-copy center · axial · 3.5mm · 0.83mm/px · 1 of 20 slices shown (15 of 23)]
[im 1/20]
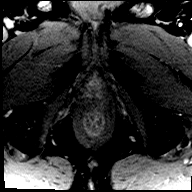

[Series 41: post t1_twist_tra_dyn-copy cent_sub_ttc=155.7s · axial · 3.5mm · 0.83mm/px · 1 of 20 slices shown]
[im 1/20]
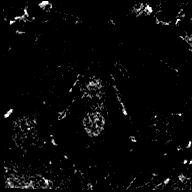

[Series 42: post t1_twist_tra_dyn-copy center · axial · 3.5mm · 0.83mm/px · 1 of 20 slices shown (16 of 23)]
[im 1/20]
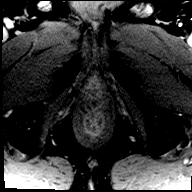

[Series 43: post t1_twist_tra_dyn-copy cent_sub_ttc=165.9s · axial · 3.5mm · 0.83mm/px · 1 of 20 slices shown]
[im 1/20]
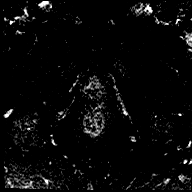

[Series 44: post t1_twist_tra_dyn-copy center · axial · 3.5mm · 0.83mm/px · 1 of 20 slices shown (17 of 23)]
[im 1/20]
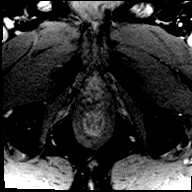

[Series 45: post t1_twist_tra_dyn-copy cent_sub_ttc=176.1s · axial · 3.5mm · 0.83mm/px · 1 of 20 slices shown]
[im 1/20]
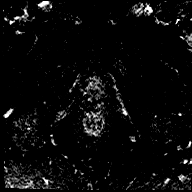

[Series 46: post t1_twist_tra_dyn-copy center · axial · 3.5mm · 0.83mm/px · 1 of 20 slices shown (18 of 23)]
[im 1/20]
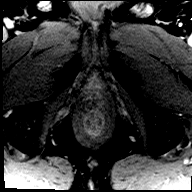

[Series 47: post t1_twist_tra_dyn-copy cent_sub_ttc=186.3s · axial · 3.5mm · 0.83mm/px · 1 of 20 slices shown]
[im 1/20]
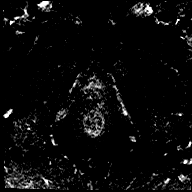

[Series 48: post t1_twist_tra_dyn-copy center · axial · 3.5mm · 0.83mm/px · 1 of 20 slices shown (19 of 23)]
[im 1/20]
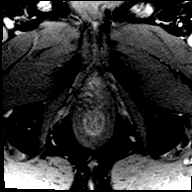

[Series 49: post t1_twist_tra_dyn-copy cent_sub_ttc=196.5s · axial · 3.5mm · 0.83mm/px · 1 of 20 slices shown]
[im 1/20]
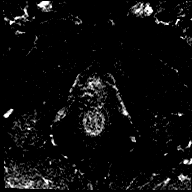

[Series 50: post t1_twist_tra_dyn-copy center · axial · 3.5mm · 0.83mm/px · 1 of 20 slices shown (20 of 23)]
[im 1/20]
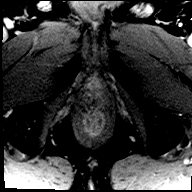

[Series 51: post t1_twist_tra_dyn-copy cent_sub_ttc=206.7s · axial · 3.5mm · 0.83mm/px · 1 of 20 slices shown]
[im 1/20]
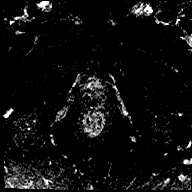

[Series 52: post t1_twist_tra_dyn-copy center · axial · 3.5mm · 0.83mm/px · 1 of 20 slices shown (21 of 23)]
[im 1/20]
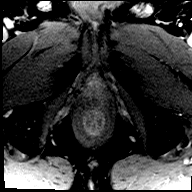

[Series 53: post t1_twist_tra_dyn-copy cent_sub_ttc=216.9s · axial · 3.5mm · 0.83mm/px · 1 of 20 slices shown]
[im 1/20]
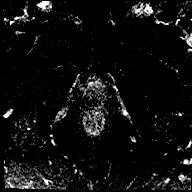

[Series 54: post t1_twist_tra_dyn-copy center · axial · 3.5mm · 0.83mm/px · 1 of 20 slices shown (22 of 23)]
[im 1/20]
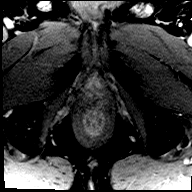

[Series 55: post t1_twist_tra_dyn-copy cent_sub_ttc=227.1s · axial · 3.5mm · 0.83mm/px · 1 of 20 slices shown]
[im 1/20]
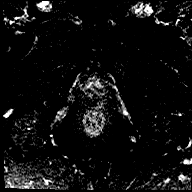

[Series 56: post t1_twist_tra_dyn-copy center · axial · 3.5mm · 0.83mm/px · 1 of 20 slices shown (23 of 23)]
[im 1/20]
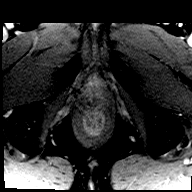

[Series 57: post t1_twist_tra_dyn-copy cent_sub_ttc=237.3s · axial · 3.5mm · 0.83mm/px · 1 of 20 slices shown]
[im 1/20]
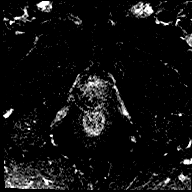

[56 of 56 positions shown; findings below may reference images not displayed]

FINDINGS: Prostate: 17 x 10 x 19 mm focal low T2 lesion in the right
posterolateral mid gland to apex (series 7/image 14). Associated
early arterial enhancement (series 28/image 13). Foci of low
ADC/restricted diffusion anteriorly within the lesion (series
11/image 12). This appearance is highly suspicious for high-grade
macroscopic prostate cancer. PI-RADS 5.

Postsurgical changes in the central gland related to prior TURP.

Volume: 4.9 x 4.0 x 3.8 cm (calculated volume 37.6 mL)

Transcapsular spread:  Absent.

Seminal vesicle involvement: Absent.

Neurovascular bundle involvement: Absent.

Pelvic adenopathy: Absent.

Bone metastasis: Absent.

Other findings: Thick-walled, trabeculated bladder, suggesting
chronic bladder outlet obstruction.

Tiny fat/fluid containing left inguinal hernia.
IMPRESSION: 19 mm lesion in the right posterolateral mid gland to apex, highly
suspicious for high-grade macroscopic prostate cancer. PI-RADS 5.

No evidence of extracapsular extension, seminal vesicle invasion, or
metastatic disease.

Postsurgical changes related to prior TURP.

Thick-walled, trabeculated bladder, suggesting chronic bladder
outlet obstruction.

## 2018-04-08 DIAGNOSIS — R972 Elevated prostate specific antigen [PSA]: Secondary | ICD-10-CM | POA: Diagnosis not present

## 2018-04-08 DIAGNOSIS — C61 Malignant neoplasm of prostate: Secondary | ICD-10-CM | POA: Diagnosis not present

## 2018-04-08 DIAGNOSIS — Z8546 Personal history of malignant neoplasm of prostate: Secondary | ICD-10-CM | POA: Diagnosis not present

## 2018-04-17 DIAGNOSIS — N429 Disorder of prostate, unspecified: Secondary | ICD-10-CM | POA: Diagnosis not present

## 2018-04-17 DIAGNOSIS — K4090 Unilateral inguinal hernia, without obstruction or gangrene, not specified as recurrent: Secondary | ICD-10-CM | POA: Diagnosis not present

## 2018-04-19 DIAGNOSIS — R82998 Other abnormal findings in urine: Secondary | ICD-10-CM | POA: Diagnosis not present

## 2018-04-19 DIAGNOSIS — Z125 Encounter for screening for malignant neoplasm of prostate: Secondary | ICD-10-CM | POA: Diagnosis not present

## 2018-04-19 DIAGNOSIS — Z79899 Other long term (current) drug therapy: Secondary | ICD-10-CM | POA: Diagnosis not present

## 2018-04-30 DIAGNOSIS — R69 Illness, unspecified: Secondary | ICD-10-CM | POA: Diagnosis not present

## 2018-04-30 DIAGNOSIS — Z1389 Encounter for screening for other disorder: Secondary | ICD-10-CM | POA: Diagnosis not present

## 2018-04-30 DIAGNOSIS — R030 Elevated blood-pressure reading, without diagnosis of hypertension: Secondary | ICD-10-CM | POA: Diagnosis not present

## 2018-04-30 DIAGNOSIS — E7849 Other hyperlipidemia: Secondary | ICD-10-CM | POA: Diagnosis not present

## 2018-04-30 DIAGNOSIS — Z0000 Encounter for general adult medical examination without abnormal findings: Secondary | ICD-10-CM | POA: Diagnosis not present

## 2018-04-30 DIAGNOSIS — Z6824 Body mass index (BMI) 24.0-24.9, adult: Secondary | ICD-10-CM | POA: Diagnosis not present

## 2018-04-30 DIAGNOSIS — C61 Malignant neoplasm of prostate: Secondary | ICD-10-CM | POA: Diagnosis not present

## 2018-05-22 DIAGNOSIS — Z6824 Body mass index (BMI) 24.0-24.9, adult: Secondary | ICD-10-CM | POA: Diagnosis not present

## 2018-05-22 DIAGNOSIS — L309 Dermatitis, unspecified: Secondary | ICD-10-CM | POA: Diagnosis not present

## 2018-06-19 DIAGNOSIS — D225 Melanocytic nevi of trunk: Secondary | ICD-10-CM | POA: Diagnosis not present

## 2018-06-19 DIAGNOSIS — L570 Actinic keratosis: Secondary | ICD-10-CM | POA: Diagnosis not present

## 2018-06-19 DIAGNOSIS — L821 Other seborrheic keratosis: Secondary | ICD-10-CM | POA: Diagnosis not present

## 2018-06-19 DIAGNOSIS — X32XXXD Exposure to sunlight, subsequent encounter: Secondary | ICD-10-CM | POA: Diagnosis not present

## 2018-06-19 DIAGNOSIS — C44519 Basal cell carcinoma of skin of other part of trunk: Secondary | ICD-10-CM | POA: Diagnosis not present

## 2018-07-30 DIAGNOSIS — C44519 Basal cell carcinoma of skin of other part of trunk: Secondary | ICD-10-CM | POA: Diagnosis not present

## 2018-07-30 DIAGNOSIS — B351 Tinea unguium: Secondary | ICD-10-CM | POA: Diagnosis not present

## 2018-07-31 ENCOUNTER — Telehealth: Payer: Self-pay

## 2018-07-31 NOTE — Telephone Encounter (Signed)
I called the patient to schedule AWV.  He stated that he has a new PCP. VDM (DD)

## 2018-08-22 DIAGNOSIS — R69 Illness, unspecified: Secondary | ICD-10-CM | POA: Diagnosis not present

## 2018-10-21 DIAGNOSIS — R972 Elevated prostate specific antigen [PSA]: Secondary | ICD-10-CM | POA: Diagnosis not present

## 2018-10-28 DIAGNOSIS — C61 Malignant neoplasm of prostate: Secondary | ICD-10-CM | POA: Diagnosis not present

## 2018-10-28 DIAGNOSIS — Z6823 Body mass index (BMI) 23.0-23.9, adult: Secondary | ICD-10-CM | POA: Diagnosis not present

## 2018-10-28 DIAGNOSIS — L988 Other specified disorders of the skin and subcutaneous tissue: Secondary | ICD-10-CM | POA: Diagnosis not present

## 2018-12-20 DIAGNOSIS — R972 Elevated prostate specific antigen [PSA]: Secondary | ICD-10-CM | POA: Diagnosis not present

## 2018-12-20 DIAGNOSIS — R6889 Other general symptoms and signs: Secondary | ICD-10-CM | POA: Diagnosis not present

## 2018-12-20 DIAGNOSIS — C61 Malignant neoplasm of prostate: Secondary | ICD-10-CM | POA: Diagnosis not present

## 2018-12-25 DIAGNOSIS — R69 Illness, unspecified: Secondary | ICD-10-CM | POA: Diagnosis not present

## 2019-01-02 DIAGNOSIS — C61 Malignant neoplasm of prostate: Secondary | ICD-10-CM | POA: Diagnosis not present

## 2019-01-02 DIAGNOSIS — R972 Elevated prostate specific antigen [PSA]: Secondary | ICD-10-CM | POA: Diagnosis not present

## 2019-01-07 DIAGNOSIS — N400 Enlarged prostate without lower urinary tract symptoms: Secondary | ICD-10-CM | POA: Diagnosis not present

## 2019-01-07 DIAGNOSIS — Z9079 Acquired absence of other genital organ(s): Secondary | ICD-10-CM | POA: Diagnosis not present

## 2019-01-07 DIAGNOSIS — R972 Elevated prostate specific antigen [PSA]: Secondary | ICD-10-CM | POA: Diagnosis not present

## 2019-01-07 DIAGNOSIS — N402 Nodular prostate without lower urinary tract symptoms: Secondary | ICD-10-CM | POA: Diagnosis not present

## 2019-01-16 DIAGNOSIS — H5213 Myopia, bilateral: Secondary | ICD-10-CM | POA: Diagnosis not present

## 2019-01-16 DIAGNOSIS — H52203 Unspecified astigmatism, bilateral: Secondary | ICD-10-CM | POA: Diagnosis not present

## 2019-01-16 DIAGNOSIS — H524 Presbyopia: Secondary | ICD-10-CM | POA: Diagnosis not present

## 2019-01-16 DIAGNOSIS — Z961 Presence of intraocular lens: Secondary | ICD-10-CM | POA: Diagnosis not present

## 2019-01-23 DIAGNOSIS — Z0100 Encounter for examination of eyes and vision without abnormal findings: Secondary | ICD-10-CM | POA: Diagnosis not present

## 2019-04-01 DIAGNOSIS — Z85828 Personal history of other malignant neoplasm of skin: Secondary | ICD-10-CM | POA: Diagnosis not present

## 2019-04-01 DIAGNOSIS — B351 Tinea unguium: Secondary | ICD-10-CM | POA: Diagnosis not present

## 2019-04-01 DIAGNOSIS — Z08 Encounter for follow-up examination after completed treatment for malignant neoplasm: Secondary | ICD-10-CM | POA: Diagnosis not present

## 2019-05-01 DIAGNOSIS — Z125 Encounter for screening for malignant neoplasm of prostate: Secondary | ICD-10-CM | POA: Diagnosis not present

## 2019-05-01 DIAGNOSIS — R82998 Other abnormal findings in urine: Secondary | ICD-10-CM | POA: Diagnosis not present

## 2019-05-01 DIAGNOSIS — E7849 Other hyperlipidemia: Secondary | ICD-10-CM | POA: Diagnosis not present

## 2019-05-01 DIAGNOSIS — R7989 Other specified abnormal findings of blood chemistry: Secondary | ICD-10-CM | POA: Diagnosis not present

## 2019-05-08 DIAGNOSIS — R69 Illness, unspecified: Secondary | ICD-10-CM | POA: Diagnosis not present

## 2019-05-08 DIAGNOSIS — R030 Elevated blood-pressure reading, without diagnosis of hypertension: Secondary | ICD-10-CM | POA: Diagnosis not present

## 2019-05-08 DIAGNOSIS — Z1339 Encounter for screening examination for other mental health and behavioral disorders: Secondary | ICD-10-CM | POA: Diagnosis not present

## 2019-05-08 DIAGNOSIS — C61 Malignant neoplasm of prostate: Secondary | ICD-10-CM | POA: Diagnosis not present

## 2019-05-08 DIAGNOSIS — Z0000 Encounter for general adult medical examination without abnormal findings: Secondary | ICD-10-CM | POA: Diagnosis not present

## 2019-05-08 DIAGNOSIS — Z1331 Encounter for screening for depression: Secondary | ICD-10-CM | POA: Diagnosis not present

## 2019-05-08 DIAGNOSIS — L989 Disorder of the skin and subcutaneous tissue, unspecified: Secondary | ICD-10-CM | POA: Diagnosis not present

## 2019-05-08 DIAGNOSIS — E785 Hyperlipidemia, unspecified: Secondary | ICD-10-CM | POA: Diagnosis not present

## 2019-05-15 ENCOUNTER — Telehealth: Payer: Self-pay | Admitting: Oncology

## 2019-05-15 NOTE — Telephone Encounter (Signed)
Received a referral from Dr. Philip Aspen for the pt discuss options for prostate cancer. Mr. Bob Larson has been scheduled to see Dr. Alen Blew on 6/9 at 2pm. Pt aware to arrive 20 minutes early.

## 2019-05-20 ENCOUNTER — Inpatient Hospital Stay: Payer: Medicare HMO | Attending: Oncology | Admitting: Oncology

## 2019-05-20 ENCOUNTER — Other Ambulatory Visit: Payer: Self-pay

## 2019-05-20 VITALS — BP 140/82 | HR 65 | Temp 98.5°F | Resp 17 | Ht 71.0 in | Wt 177.3 lb

## 2019-05-20 DIAGNOSIS — K589 Irritable bowel syndrome without diarrhea: Secondary | ICD-10-CM | POA: Insufficient documentation

## 2019-05-20 DIAGNOSIS — Z79899 Other long term (current) drug therapy: Secondary | ICD-10-CM | POA: Insufficient documentation

## 2019-05-20 DIAGNOSIS — K219 Gastro-esophageal reflux disease without esophagitis: Secondary | ICD-10-CM | POA: Diagnosis not present

## 2019-05-20 DIAGNOSIS — C61 Malignant neoplasm of prostate: Secondary | ICD-10-CM | POA: Insufficient documentation

## 2019-05-20 DIAGNOSIS — N400 Enlarged prostate without lower urinary tract symptoms: Secondary | ICD-10-CM

## 2019-05-20 NOTE — Progress Notes (Signed)
Reason for the request:   Prostate cancer  HPI: I was asked by Dr. Sharlett Iles to evaluate Bob Larson for the evaluation of prostate cancer.  He is a 73 year old man was diagnosed with prostate cancer in 2016.  At that time he was found to have elevated PSA dating back to 2010 which was around 4.45.  His PSA went up to 9.9 and was evaluated by Dr. Thomasene Mohair at [facility] and underwent prostate biopsy and September 2016.  The biopsy did not show any evidence of malignancy and subsequently underwent TURP procedure in November 2016 which showed a Gleason score 3+4 = 7 and 10% involvement of 1 core.  He remained on active surveillance with a PSA continues to rise and monitored by Dr. Sharlett Iles.  He has been also evaluated by Dr. Junious Silk who recommended fusion biopsy but declined to undergo the procedure.  He was evaluated at [facility] for a opinion.  He had an MRI in January 2020 which showed 3.0 cm lesion in the right peripheral zone that is classified as PI-RADS 5 and extending into the apex.  There is also concern for violation of the capsule of the right neurovascular invasion.  Based on these findings it was recommended that he proceeds with treatment without any need for a repeat biopsy.  A repeat PSA at that time was up to 14 but subsequently repeated in May 2020 and declined to 12 under the care of Dr. Sharlett Iles.  He wanted to reestablish care locally and has another opinion regarding treatment options.  Clinically, at this point reports no major urinary symptoms.  Remains active and continues to attend activities of daily living.   He does not report any headaches, blurry vision, syncope or seizures. Does not report any fevers, chills or sweats.  Does not report any cough, wheezing or hemoptysis.  Does not report any chest pain, palpitation, orthopnea or leg edema.  Does not report any nausea, vomiting or abdominal pain.  Does not report any constipation or diarrhea.  Does not  report any skeletal complaints.    Does not report frequency, urgency or hematuria.  Does not report any skin rashes or lesions. Does not report any heat or cold intolerance.  Does not report any lymphadenopathy or petechiae.  Does not report any anxiety or depression.  Remaining review of systems is negative.    Past Medical History:  Diagnosis Date  . BPH (benign prostatic hyperplasia)   . Cancer of skin, squamous cell    located on head  . Collagenous colitis   . Elevated PSA   . GERD (gastroesophageal reflux disease)   . H/O colonoscopy 2015   Dr. Collene Mares  . Hernia, inguinal, bilateral   . Hx of vasectomy 1984   Dr. Joelyn Oms  . IBS (irritable bowel syndrome)   . Nephrolith   . Prostatitis   . S/P TURP (status post transurethral resection of prostate) 2016   Dr. Thomasene Mohair  :  No past surgical history on file.:   Current Outpatient Medications:  .  budesonide (ENTOCORT EC) 3 MG 24 hr capsule, Take 9 mg by mouth as needed., Disp: , Rfl:  .  clonazePAM (KLONOPIN) 1 MG tablet, Take 1/2 to 1 tablet by mouth 3 times daily as needed, Disp: 90 tablet, Rfl: 1:  Allergies  Allergen Reactions  . Penicillins   :  Family History  Problem Relation Age of Onset  . AAA (abdominal aortic aneurysm) Father 70  . Pneumonia Mother 35  .  Kidney failure Brother   . Pneumonia Brother   . Heart attack Brother 28  :  Social History   Socioeconomic History  . Marital status: Married    Spouse name: Not on file  . Number of children: Not on file  . Years of education: Not on file  . Highest education level: Not on file  Occupational History  . Not on file  Social Needs  . Financial resource strain: Not on file  . Food insecurity:    Worry: Not on file    Inability: Not on file  . Transportation needs:    Medical: Not on file    Non-medical: Not on file  Tobacco Use  . Smoking status: Never Smoker  . Smokeless tobacco: Never Used  Substance and Sexual Activity  . Alcohol use: No     Alcohol/week: 0.0 standard drinks  . Drug use: No  . Sexual activity: Not on file  Lifestyle  . Physical activity:    Days per week: Not on file    Minutes per session: Not on file  . Stress: Not on file  Relationships  . Social connections:    Talks on phone: Not on file    Gets together: Not on file    Attends religious service: Not on file    Active member of club or organization: Not on file    Attends meetings of clubs or organizations: Not on file    Relationship status: Not on file  . Intimate partner violence:    Fear of current or ex partner: Not on file    Emotionally abused: Not on file    Physically abused: Not on file    Forced sexual activity: Not on file  Other Topics Concern  . Not on file  Social History Narrative   Diet?  Normal      Do you drink/eat things with caffeine? Yes      Marital status?            M                        What year were you married? 1979      Do you live in a house, apartment, assisted living, condo, trailer, etc.? House      Is it one or more stories? One      How many persons live in your home? 2      Do you have any pets in your home? (please list) no      Current or past profession: Education officer, museum, Property tax accounting      Do you exercise?          yes                            Type & how often? Bicycling every 4th day      Do you have a living will? no      Do you have a DNR form?      no                            If not, do you want to discuss one? no      Do you have signed POA/HPOA for forms? no     :  Pertinent items are noted in HPI.  Exam: Blood pressure 140/82, pulse 65, temperature 98.5  F (36.9 C), temperature source Oral, resp. rate 17, height 5\' 11"  (1.803 m), weight 177 lb 4.8 oz (80.4 kg), SpO2 99 %.  ECOG 0   General appearance: alert and cooperative appeared without distress. Head: atraumatic without any abnormalities. Eyes: conjunctivae/corneas clear. PERRL.  Sclera anicteric. Throat:  lips, mucosa, and tongue normal; without oral thrush or ulcers. Resp: clear to auscultation bilaterally without rhonchi, wheezes or dullness to percussion. Cardio: regular rate and rhythm, S1, S2 normal, no murmur, click, rub or gallop GI: soft, non-tender; bowel sounds normal; no masses,  no organomegaly Skin: Skin color, texture, turgor normal. No rashes or lesions Lymph nodes: Cervical, supraclavicular, and axillary nodes normal. Neurologic: Grossly normal without any motor, sensory or deep tendon reflexes. Musculoskeletal: No joint deformity or effusion.    Assessment and Plan:   73 year old man with the following:  1.  Prostate cancer diagnosed in 2016.  At that time he has a long history of fluctuating PSA although has been on a slow rise since that time.  He had a prostate biopsy in 2016 that was nondiagnostic for any malignancy but did have a Gleason score of 3+4 = 7 on specimen status post TURP.  He also had a suspicious lesion of PI-RADS 5 involving the posterior lateral peripheral zone in January 2020 which is documented previously in March 2019.  The natural course of this disease was discussed today as well as diagnostic options as well as treatment options were reviewed.  Ideally repeating a biopsy of this lesion would be helpful to update the pathology of this cancer as well as give [place] an idea from a prognostication standpoint.  Subsequent to that, it is unlikely he will require definitive treatment at some point.  These options would include surgery versus radiation.  We have also talked about not curative options which include hormone treatment at some point if he becomes symptomatic.  He has a lot of reservations about obtaining a biopsy because of history of colitis.  He has also has reservation about surgery and radiation.  I have discussed with him the possibility of him developing systemic disease at some point that can be palliated with systemic therapy but curative options  potentially only can occur at his current stage.  After discussion today, I have recommended that his case to be presented in the prostate cancer multidisciplinary clinic.  An opinion from Dr. Alinda Money as well as Dr. Tammi Klippel will be helpful in this particular setting.  He is agreeable at this time and will arrange that for the near future.  2.  Follow-up: We will be determined pending the results of future discussion.  60  minutes was spent with the patient face-to-face today.  More than 50% of time was spent on reviewing his disease status, MRI results, treatment options as well as complications related to therapy.     Thank you for the referral.  I had the pleasure of meeting this patient today.  A copy of this consult has been forwarded to the requesting physician.

## 2019-05-26 ENCOUNTER — Encounter: Payer: Self-pay | Admitting: Medical Oncology

## 2019-05-28 DIAGNOSIS — D075 Carcinoma in situ of prostate: Secondary | ICD-10-CM | POA: Diagnosis not present

## 2019-06-02 ENCOUNTER — Telehealth: Payer: Self-pay | Admitting: Medical Oncology

## 2019-06-02 ENCOUNTER — Telehealth: Payer: Self-pay | Admitting: Oncology

## 2019-06-02 ENCOUNTER — Encounter: Payer: Self-pay | Admitting: Medical Oncology

## 2019-06-02 NOTE — Progress Notes (Signed)
   I called pt to introduce myself as the Prostate Nurse Navigator and the Coordinator of the Prostate [place].  1. I confirmed with the patient he is aware of his referral to the clinic 06/03/2019. I informed him that the visit will be held via WebEx due to COVID-19. I will call him on Monday to verify the time and to do a trial run.  2. I discussed the format of the clinic and the physicians he will be seeing that day.He consulted with Dr.Shadad 6/9 and he felt it would be beneficial for him to be seen in the [facility].  3. I confirmed his address and informed him I would be mailing a packet of information and forms to be completed. I informed him that the form results would be discussed during the clinic.  He voiced understanding of the above. I asked him to call me if he has any questions or concerns regarding his appointments or the forms he needs to complete.  He was very impressed that the radiologist will be present to compared his MRI's form 2019 and 2020.

## 2019-06-02 NOTE — Telephone Encounter (Signed)
Called patient regarding upcoming Webex appointment, patient is notified and e-mail has been sent. °

## 2019-06-02 NOTE — Telephone Encounter (Signed)
Spoke with patient to confirm [facility] 06/03/19 1:00pm. We did a trial run WebEx and it went well. I will call him tomorrow before I begin the meeting. He voiced understanding.

## 2019-06-03 ENCOUNTER — Ambulatory Visit
Admission: RE | Admit: 2019-06-03 | Discharge: 2019-06-03 | Disposition: A | Discharge status: Home / Self Care | Payer: Medicare HMO | Source: Ambulatory Visit | Attending: Radiation Oncology | Admitting: Radiation Oncology

## 2019-06-03 ENCOUNTER — Encounter: Payer: Self-pay | Admitting: Medical Oncology

## 2019-06-03 ENCOUNTER — Other Ambulatory Visit: Payer: Self-pay | Admitting: Oncology

## 2019-06-03 ENCOUNTER — Inpatient Hospital Stay (HOSPITAL_BASED_OUTPATIENT_CLINIC_OR_DEPARTMENT_OTHER): Payer: Medicare HMO | Admitting: Oncology

## 2019-06-03 DIAGNOSIS — C61 Malignant neoplasm of prostate: Secondary | ICD-10-CM

## 2019-06-03 DIAGNOSIS — R972 Elevated prostate specific antigen [PSA]: Secondary | ICD-10-CM | POA: Diagnosis not present

## 2019-06-03 DIAGNOSIS — N400 Enlarged prostate without lower urinary tract symptoms: Secondary | ICD-10-CM | POA: Diagnosis not present

## 2019-06-03 NOTE — Progress Notes (Signed)
Hematology and Oncology Follow Up for Telemedicine Visits  SAIVON PROWSE [id number] 11-16-1946 73 y.o. 06/03/2019 3:22 PM Leanna Battles, MDPaterson, Quillian Quince, MD   I connected with Mr. Hickel on 06/03/19 at  2:15 PM EDT by video enabled telemedicine visit and verified that I am speaking with the correct person using two identifiers.   I discussed the limitations, risks, security and privacy concerns of performing an evaluation and management service by telemedicine and the availability of in-person appointments. I also discussed with the patient that there may be a patient responsible charge related to this service. The patient expressed understanding and agreed to proceed.  Other persons participating in the visit and their role in the encounter:  None  Patient's location:  Home Provider's location: Office   Principle Diagnosis: 73 year old with prostate cancer diagnosed in 2016.  He was found to have Gleason score 3+4 = 7 and a PSA of 9.9.   Current therapy: Under evaluation for definitive treatment.  Interim History: Mr. Fehl was presented today in the prostate cancer multidisciplinary clinic.  He reports clinically no complaints at this time.  Denies any urinary symptoms or recent hospitalizations.  He denies any recent back pain or abdominal distention.  He denies any decline in his performance status.    Medications: I have reviewed the patient's current medications.  Current Outpatient Medications  Medication Sig Dispense Refill  . budesonide (ENTOCORT EC) 3 MG 24 hr capsule Take 9 mg by mouth as needed.    . clonazePAM (KLONOPIN) 1 MG tablet Take 1/2 to 1 tablet by mouth 3 times daily as needed 90 tablet 1   No current facility-administered medications for this visit.      Allergies:  Allergies  Allergen Reactions  . Penicillins     Past Medical History, Surgical history, Social history, and Family History were reviewed and updated.     Lab  Results: Lab Results  Component Value Date   WBC 6.0 09/14/2017   HGB 14.7 09/14/2017   HCT 44.4 09/14/2017   MCV 84.3 09/14/2017   PLT 170 09/14/2017     Chemistry      Component Value Date/Time   NA 141 09/14/2017 0815   K 3.7 09/14/2017 0815   CL 107 09/14/2017 0815   CO2 24 09/14/2017 0815   BUN 18 09/14/2017 0815   CREATININE 1.19 (H) 09/14/2017 0815      Component Value Date/Time   CALCIUM 9.0 09/14/2017 0815   ALKPHOS 47 09/11/2016 0802   AST 18 09/14/2017 0815   ALT 16 09/14/2017 0815   BILITOT 0.6 09/14/2017 0815       Impression and Plan:  73 year old man with:  1.  Prostate cancer presented with a PSA of 9.9 and found to have a Gleason score of 3+4 = 7 diagnosed in 2016.   He has not received any active treatment at this time and has been on active surveillance.  His case was discussed today in the prostate cancer multidisciplinary clinic.  His imaging studies as well as his pathology reports were reviewed with the reviewing pathologist and radiologist.  The natural course of this disease was reviewed today with the patient.  The need for definitive treatment was also emphasized again.  Options of therapy were reviewed which include primary surgical therapy versus definitive therapy with radiation and androgen deprivation.  Complication and duration associated with androgen deprivation was discussed today in detail.  Complications that include weight gain, sexual dysfunction, hot flashes among others were reviewed.  The risk of not treating his cancer was also emphasized at this time.  Risk of metastatic disease and incurable malignancy as well as urinary symptoms were reiterated.  At this time he will consider these options and make a decision in the future.  2.  Follow-up: We will be determined by his decision to receive treatment. .    I discussed the assessment and treatment plan with the patient. The patient was provided an opportunity to ask  questions and all were answered. The patient agreed with the plan and demonstrated an understanding of the instructions.   The patient was advised to call back or seek an in-person evaluation if the symptoms worsen or if the condition fails to improve as anticipated.  I provided 25 minutes of face-to-face video visit time during this encounter, and > 50% was spent on reviewing his disease status, reviewing imaging studies and pathology reports.  We also discussed the natural course of this disease as well as complications related to therapy.  Zola Button, MD 06/03/2019 3:22 PM

## 2019-06-03 NOTE — Progress Notes (Signed)
Radiation Oncology         [phone] ________________________________  Multidisciplinary Prostate Cancer Clinic  Initial Radiation Oncology Consultation - Conducted via Webex due to current COVID-19 concerns for limiting patient exposure  Name: Bob Larson MRN: [id number]  Date: 06/03/2019  DOB: October 01, 1946  WF:[id number], Quillian Quince, MD  Raynelle Bring, MD   REFERRING PHYSICIAN: Raynelle Bring, MD  DIAGNOSIS: 73 y.o. gentleman with stage T3a, locally advanced adenocarcinoma of the prostate with a Gleason's score of 3+4 and a PSA of 12.3    ICD-10-CM   1. Malignant neoplasm of prostate ([place])  C61     HISTORY OF PRESENT ILLNESS::Bob Larson is a 73 y.o. gentleman with a history of prostate cancer, initially diagnosed in 2016 via TURP.  He has a history of elevated PSA dating back to 2010 which was around 4-5 at that time.  He was initially seen with Dr. Louis Meckel for a PSA of 6.8 in 2015 but refused biopsy and did not show up for any further follow-up.  His PSA increased to 9.9 in 2016, and he was evaluated by Dr. Thomasene Mohair in [facility]. He underwent prostate biopsy in September 2016 which revealed only HGPIN and did not show any evidence of malignancy. He subsequently underwent TURP procedure in November 2016 which revealed Gleason 3+4 prostatic adenocarcinoma involving about 20% of the submitted tissue. He remained on active surveillance with his PCP, Dr. Philip Aspen, and PSA continued to rise. He underwent prostate MRI on 03/01/2018 which showed a 19 mm lesion in the right posterolateral mid gland to apex (PI-RADS 5). He was evaluated by Dr. Junious Silk who recommended fusion biopsy, but the patient declined to undergo the procedure.  He was then evaluated at [facility] for a second opinion.  His PSA had increased to 14 in 12/2018. He underwent a second prostate MRI on 01/07/2019 which showed a 3.0 cm, PI-RADS 5 lesion involving the posterior lateral peripheral zone of  the prostate, extending from base to apex, with concern for violation of the capsule and right neurovascular invasion. Based on these findings, it was recommended that he proceed with treatment with ADT and XRT without any need for a repeat biopsy.  A repeat PSA in May 2020 declined to 12.3 so the patient wished to reestablish care locally and get another opinion regarding treatment options.   The patient reviewed the PSA, imaging and biopsy results with his urologist and he has kindly been referred today to the multidisciplinary prostate cancer clinic for presentation of pathology and radiology studies in our conference for discussion of potential radiation treatment options and clinical evaluation.   PREVIOUS RADIATION THERAPY: No  PAST MEDICAL HISTORY:  has a past medical history of BPH (benign prostatic hyperplasia), Cancer of skin, squamous cell, Collagenous colitis, Elevated PSA, GERD (gastroesophageal reflux disease), H/O colonoscopy (2015), Hernia, inguinal, bilateral, vasectomy (1984), IBS (irritable bowel syndrome), Nephrolith, Prostatitis, and S/P TURP (status post transurethral resection of prostate) (2016).    PAST SURGICAL HISTORY:No past surgical history on file.  FAMILY HISTORY: family history includes AAA (abdominal aortic aneurysm) (age of onset: 54) in his father; Heart attack (age of onset: 61) in his brother; Kidney failure in his brother; Pneumonia in his brother; Pneumonia (age of onset: 32) in his mother.  SOCIAL HISTORY:  reports that he has never smoked. He has never used smokeless tobacco. He reports that he does not drink alcohol or use drugs.  ALLERGIES: Penicillins  MEDICATIONS:  Current Outpatient Medications  Medication Sig  Dispense Refill   budesonide (ENTOCORT EC) 3 MG 24 hr capsule Take 9 mg by mouth as needed.     clonazePAM (KLONOPIN) 1 MG tablet Take 1/2 to 1 tablet by mouth 3 times daily as needed 90 tablet 1   No current facility-administered  medications for this encounter.     REVIEW OF SYSTEMS:  On review of systems, the patient reports that he is doing well overall. He denies any chest pain, shortness of breath, cough, fevers, chills, night sweats, unintended weight changes. He denies abdominal pain, nausea or vomiting. He reports baseline diarrhea, related to colitis. He denies any new musculoskeletal or joint aches or pains. He states that he is not able to ejaculate and has not been sexually active since his TURP. A complete review of systems is obtained and is otherwise negative.   PHYSICAL EXAM:  Wt Readings from Last 3 Encounters:  05/20/19 177 lb 4.8 oz (80.4 kg)  09/19/17 178 lb (80.7 kg)  09/13/16 185 lb 6.4 oz (84.1 kg)   Temp Readings from Last 3 Encounters:  05/20/19 98.5 F (36.9 C) (Oral)  09/19/17 97.7 F (36.5 C) (Oral)  09/13/16 97.8 F (36.6 C) (Oral)   BP Readings from Last 3 Encounters:  05/20/19 140/82  09/19/17 130/78  09/13/16 132/84   Pulse Readings from Last 3 Encounters:  05/20/19 65  09/19/17 (!) 51  09/13/16 60    /10  In general this is a well appearing caucasian male in no acute distress. He's alert and oriented x4 and appropriate throughout the examination. Cardiopulmonary assessment is negative for acute distress and he exhibits normal effort. Remainder of exam not performed in light of virtual consultation platform.  KPS = 90  100 - Normal; no complaints; no evidence of disease. 90   - Able to carry on normal activity; minor signs or symptoms of disease. 80   - Normal activity with effort; some signs or symptoms of disease. 11   - Cares for self; unable to carry on normal activity or to do active work. 60   - Requires occasional assistance, but is able to care for most of his personal needs. 50   - Requires considerable assistance and frequent medical care. 35   - Disabled; requires special care and assistance. 57   - Severely disabled; hospital admission is indicated although  death not imminent. 64   - Very sick; hospital admission necessary; active supportive treatment necessary. 10   - Moribund; fatal processes progressing rapidly. 0     - Dead  Karnofsky DA, Abelmann [place], Craver LS and Burchenal [facility] [phone]) The use of the nitrogen mustards in the palliative treatment of carcinoma: with particular reference to bronchogenic carcinoma Cancer 1 [id number]   LABORATORY DATA:  Lab Results  Component Value Date   WBC 6.0 09/14/2017   HGB 14.7 09/14/2017   HCT 44.4 09/14/2017   MCV 84.3 09/14/2017   PLT 170 09/14/2017   Lab Results  Component Value Date   NA 141 09/14/2017   K 3.7 09/14/2017   CL 107 09/14/2017   CO2 24 09/14/2017   Lab Results  Component Value Date   ALT 16 09/14/2017   AST 18 09/14/2017   ALKPHOS 47 09/11/2016   BILITOT 0.6 09/14/2017     RADIOGRAPHY: No results found.    IMPRESSION/PLAN: 73 y.o. gentleman with Stage T3a, locally advanced adenocarcinoma of the prostate with a PSA of 12.3 and a Gleason score of 3+4.    We discussed  the patient's workup and outlined the nature of prostate cancer in this setting. The patient's T stage, Gleason's score, and PSA put him into the intermediate risk group. However, based on the MRI findings, he is felt to be in the high risk group.  The recommendation is to proceed with a bone scan to complete his metastatic evaluation.  Based on the current information, he is eligible for androgen deprivation therapy (ADT) in combination with 8 weeks of external radiation versus repeat biopsy. We discussed the available radiation techniques, and focused on the details and logistics and delivery. We discussed and outlined the risks, benefits, short and long-term effects associated with radiotherapy and compared and contrasted these with prostatectomy. We discussed the role of SpaceOAR in reducing the rectal toxicity associated with radiotherapy. We also detailed the role of ADT in the treatment of high-risk prostate  cancer and outlined the associated side effects that could be expected with this therapy.    At the end of the conversation the patient is undecided at this time about which treatment option he would like to pursue.  He would like to complete his metastatic evaluation with bone scan as recommended prior to making a final decision regarding ADT in combination with prostate IMRT.  He will be referred back to Dr. Louis Meckel for further urologic care and we will follow up with him on his final decision in the near future. He was encouraged to call with any additional questions.  This encounter was provided by telemedicine platform Webex.  The patient has given verbal consent for this type of encounter and has been advised to only accept a meeting of this type in a secure network environment. The time spent during this encounter was 40 minutes. The attendants for this meeting include Tyler Pita MD, [address] PA-C, scribe Clinton Sawyer, and patient, Bob Larson.  During the encounter, Tyler Pita MD, Ashlyn Bruning PA-C, and scribe Clinton Sawyer were located at [facility] Radiation Oncology Department.  Patient, Bob Larson was located at home.    Nicholos Johns, PA-C    Tyler Pita, MD  [facility] Direct Dial: [phone]   Fax: [phone] [email or web address]   Skype   [email or web address]  This document serves as a record of services personally performed by Tyler Pita, MD and Freeman Caldron, PA-C. It was created on their behalf by Rae Lips, a trained medical scribe. The creation of this record is based on the scribe's personal observations and the providers' statements to them. This document has been checked and approved by the attending providers.

## 2019-06-03 NOTE — Progress Notes (Signed)
Appendix to consult note with images from prostate conference.      Lt lateral apex high grade PIN   TURP showed 20% cancer      PCP ordered MRI showing TURP defect     This axial shows suspicious PIRADS 5 lesion greater than 15 mm

## 2019-06-03 NOTE — Consult Note (Signed)
Telehealth Visit     06/03/2019   --------------------------------------------------------------------------------   Bob Larson  MRN: [id number]  DOB: 27-Nov-1946, 73 year old Male  SSN: [id number]   PRIMARY CARE:  Ermalene Searing. Philip Aspen, MD  REFERRING:  Ermalene Searing. Philip Aspen, MD  PROVIDER:  Raynelle Bring, M.D.  LOCATION:  [facility] [phone]     --------------------------------------------------------------------------------   CC/HPI: CC: Prostate Cancer   Physician requesting consult: Dr. Zola Button  PCP: Dr. Leanna Battles  Location of consult: Telehealth visit with the [facility] Cancer [facility]   [name] is a 73 year old gentleman with a long standing history of an elevated PSA. His PSA was 4.84 in 2012 and increased to 6.88 in January 2015. He was seen by Dr. Burman Nieves and recommended to undergo a prostate biopsy but refused. His PSA further increased to 9.9 in 2016 and he switched urologists and saw Dr. Fenton Malling. He underwent a 12 core prostate biopsy finally in September 2016 that was negative for malignancy. He had significant LUTS and eventually was treated by Dr. Thomasene Mohair with a TURP on 11/03/15. His pathology demonstrated Gleason 3+4=7 adenocarcinoma in 20% of the TURP specimen. His voiding symptoms improved. After discussion with Dr. Thomasene Mohair, he elected active surveillance management. He had an MRI of the prostate on 03/01/18 when his PSA had increased to 15.1 and this demonstrated a 1.9 cm PI-RADS 5 right posterolateral lesion. He sought an opinion again with Dr. Louis Meckel in April 2019 and was recommended to undergo an MR/US fusion biopsy but he ultimately refused to schedule. He has since sought another opinion at [facility] and another MRI in January 2020 indicated that he now had a 3.0 cm PI-RADS 5 lesion with evidence of extraprostatic extension consistent with locally advanced disease. He was recommended to proceed with  definitive treatment but elected to seek yet another opinion with Dr. Alen Blew at [facility]. After further discussion, it was recommended that he follow up in our multidisciplinary clinic for further discussion.   Family history:   Imaging studies: See above.   PMH: He has a history of GERD, collagenous colitis, IBS.  PSH: TURP, no abdominal surgeries.   TNM stage: cT3a N0 Mx  PSA: 15.1  Gleason score: 3+4=7   Urinary function: He voids well with very minimal symptoms.  Erectile function: He has not been particularly sexually active since his TURP and developing retrograde ejaculation which he states has "ruined" his sex life.     ALLERGIES: Penicillins    MEDICATIONS: Clonazepam 1 mg tablet Oral  ENTOCORT EC PO PRN     GU PSH: Cystoscopy - about 2016 Cystoscopy TURP - about 2016 Hernia Repair W/mesh - about 2011 Prostate Needle Biopsy - about 2016 Vasectomy - about 1984       PSH Notes: Surgery Of Male Genitalia Vasectomy, Hernia Repair   NON-GU PSH: Cataract Surgery.. - about 2018 Colonoscopy - about 2015 Inquinal hernia repair (open) - about 2009     GU PMH: History of prostate cancer - 04/08/2018 Acute prostatitis, Acute prostatitis - 2015 Elevated PSA, Elevated prostate specific antigen (PSA) - 2015 Granulomatous prostatitis, Granulomatous prostatitis - 2015 History of urolithiasis, History of renal calculi - 2015    NON-GU PMH: Anxiety, Anxiety - 2015 Encounter for general adult medical examination without abnormal findings, Encounter for preventive health examination - 2015 Personal history of other diseases of the digestive system, History of esophageal reflux - 2015 Personal history of other mental and behavioral  disorders, History of depression - 2015    FAMILY HISTORY: Aneurysm - Father, Runs In Family Heart Attack - Brother Heart Disease - Brother   SOCIAL HISTORY: Marital Status: Married Current Smoking Status: Patient has never smoked.  Does not  use smokeless tobacco. Has not drank since 03/08/2008. Had 3 drinks per week. Does not use drugs. Drinks 1 caffeinated drink per day. Has not had a blood transfusion. 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     Notes: Death in the family, father, Caffeine use, Alcohol use, Retired, Two children, Death in the family, mother, Never a smoker, Married   REVIEW OF SYSTEMS:    GU Review Male:   Patient denies frequent urination, hard to postpone urination, burning/ pain with urination, get up at night to urinate, leakage of urine, stream starts and stops, trouble starting your streams, and have to strain to urinate .  Gastrointestinal (Upper):   Patient denies nausea and vomiting.  Gastrointestinal (Lower):   Patient denies diarrhea and constipation.  Constitutional:   Patient denies fever, night sweats, weight loss, and fatigue.  Skin:   Patient denies skin rash/ lesion and itching.  Eyes:   Patient denies blurred vision and double vision.  Ears/ Nose/ Throat:   Patient denies sore throat and sinus problems.  Hematologic/Lymphatic:   Patient denies swollen glands and easy bruising.  Cardiovascular:   Patient denies leg swelling and chest pains.  Respiratory:   Patient denies cough and shortness of breath.  Endocrine:   Patient denies excessive thirst.  Musculoskeletal:   Patient denies back pain and joint pain.  Neurological:   Patient denies headaches and dizziness.  Psychologic:   Patient denies depression and anxiety.   PAST DATA REVIEWED:  Source Of History:  Patient  Lab Test Review:   PSA  Records Review:   Pathology Reports, Previous Patient Records  X-Ray Review: MRI Prostate GSORAD: Reviewed Films.     04/08/18 01/01/14  PSA  Total PSA 9.72 ng/mL 6.88     PROCEDURES:          Teleheatlh This patient encounter is appropriate and reasonable under the circumstances given the patient's particular presentation at this time. The patient has been advised of  the potential risks and limitations of this mode of treatment (including, but not limited to, the absence of in-person examination) and has agreed to be treated in a remote fashion in spite of them.   Any and all of the patient's/patient's family's questions on this issue have been answered, and I have made no promises or guarantees to the patient. The patient has also been advised to contact this office for worsening conditions or problems, and seek emergency medical treatment and/or call 911 if the patient deems either necessary.    ASSESSMENT:      ICD-10 Details  1 GU:   Prostate Cancer - C61      PLAN:           Document Letter(s):  Created for Patient: Clinical Summary         Notes:   1. Locally advanced prostate cancer: I had a long and detailed discussion with Mr. Condrey regarding his prostate cancer situation. We reviewed his history dating back to 2010 when he was 1st noted to have an elevated PSA. We discussed his diagnosis in 2016 which confirmed Gleason 3+4=7 adenocarcinoma the prostate in a significant component of his TURP specimen. This was confirmed upon review of his pathology today. Furthermore, we discussed  his subsequent evaluation and in particular his most recent MRI demonstrating an enlarging PI RADS 5 lesion with concern for extraprostatic extension.   I explained that I am concerned he has locally advanced prostate cancer at this time. I have recommended that he complete his metastatic evaluation with a bone scan. I explained that further delay in treatment likely could lead to the development of metastatic disease resulting in a missed opportunity to provide curative therapy. Considering his age, life expectancy, and prostate cancer situation, I have recommended that he strongly consider therapy of curative intent for high risk disease. He is not particularly interested in surgery but has previously discussed the options of long-term androgen deprivation and radiation  therapy. We reviewed that treatment in detail today. Furthermore, he has discussed his options with Dr. Tammi Klippel and Dr. Alen Blew.   He has numerous questions which were answered to the best of my stated satisfaction. He did ask if he should undergo a another biopsy. Considering the concern for locally advanced disease, I do not think information for another biopsy will likely alter recommendations for treatment. I therefore did not recommend this. He is inclined to complete his metastatic evaluation. He will then make a final determination on whether he does wish to proceed with androgen deprivation and radiation therapy in [place]. He has previously been a patient of Dr. Louis Meckel and I would certainly defer further urologic care in [facility] to Dr. Louis Meckel. However, I also told him that I would be happy to be involved with his care if he is so inclined.   Cc: Dr. Leanna Battles  Dr. Tyler Pita  Dr. Zola Button    E & M CODE: I spent at least 48 minutes face to face with the patient, more than 50% of that time was spent on counseling and/or coordinating care.

## 2019-06-04 ENCOUNTER — Telehealth: Payer: Self-pay | Admitting: Oncology

## 2019-06-04 NOTE — Telephone Encounter (Signed)
No 6/23 los

## 2019-06-05 NOTE — Progress Notes (Signed)
                               Care Plan Summary  Name: Bob Larson DOB: 11/27/1946   Your Medical Team:   Urologist -  Dr. Raynelle Bring, [facility]  Radiation Oncologist - Dr. Tyler Pita, [facility]   Medical Oncologist - Dr. Zola Button, [place]  Recommendations: 1) Bone scan for staging  2) Androgen deprivation with radiaiton   * These recommendations are based on information available as of today's consult.      Recommendations may change depending on the results of further tests or exams  Next Steps: 1) You will be contacted with appointment for bone scan.    When appointments need to be scheduled, you will be contacted by [facility] and/or [facility].  Questions?  Please do not hesitate to call Cira Rue, RN, BSN, OCN at [phone] any questions or concerns.  Shirlean Mylar is your Oncology Nurse Navigator and is available to assist you while you're receiving your medical care at [facility].

## 2019-06-06 DIAGNOSIS — C61 Malignant neoplasm of prostate: Secondary | ICD-10-CM | POA: Insufficient documentation

## 2019-06-16 ENCOUNTER — Telehealth: Payer: Self-pay | Admitting: Medical Oncology

## 2019-06-16 NOTE — Telephone Encounter (Signed)
Spoke with Bob Larson to follow up post [facility]. He states the clinic was very informative and helped him to understand, why he needs to pursue treatment. He has decided to move forward with androgen deprivation and radiation as treatment. He is scheduled for bone scan 7/8 to complete staging. I informed him, Dr.Borden's office will contact him with an  appointment for the hormone injection. We discussed fiducial markers, SpaceOar and CT simulation. He takes a variety of supplements and asked if they would interfere with the ADT or radiation. I told him most supplements are safe but I would recommend he bring a list to Dr. Alinda Money and  CT simulation appointments to review with the providers. He voiced understanding of the above and I asked him to call me with further questions or concerns.

## 2019-06-18 ENCOUNTER — Other Ambulatory Visit: Payer: Self-pay

## 2019-06-18 ENCOUNTER — Encounter (HOSPITAL_COMMUNITY)
Admission: RE | Admit: 2019-06-18 | Discharge: 2019-06-18 | Disposition: A | Discharge status: Home / Self Care | Payer: Medicare HMO | Source: Ambulatory Visit | Attending: Oncology | Admitting: Oncology

## 2019-06-18 DIAGNOSIS — C61 Malignant neoplasm of prostate: Secondary | ICD-10-CM | POA: Diagnosis present

## 2019-06-18 DIAGNOSIS — M4185 Other forms of scoliosis, thoracolumbar region: Secondary | ICD-10-CM | POA: Diagnosis not present

## 2019-06-18 MED ORDER — TECHNETIUM TC 99M MEDRONATE IV KIT
20.0000 | PACK | Freq: Once | INTRAVENOUS | Status: AC | PRN
  Administered 2019-06-18: 20 via INTRAVENOUS

## 2019-06-19 ENCOUNTER — Telehealth: Payer: Self-pay | Admitting: *Deleted

## 2019-06-19 NOTE — Telephone Encounter (Signed)
CALLED PATIENT TO INFORM OF APPT. FOR HORMONE INJ. ON 06-23-19 - ARRIVAL TIME- 1:45 PM @ [facility], SPOKE WITH PATIENT AND HE IS AWARE OF THIS INJ.

## 2019-06-20 ENCOUNTER — Telehealth: Payer: Self-pay | Admitting: Medical Oncology

## 2019-06-20 ENCOUNTER — Telehealth: Payer: Self-pay

## 2019-06-20 NOTE — Telephone Encounter (Signed)
-----   Message from Wyatt Portela, MD sent at 06/20/2019 10:55 AM EDT ----- Please let him know his scan is normal.

## 2019-06-20 NOTE — Telephone Encounter (Signed)
Contacted patient and made aware of scan results.

## 2019-06-20 NOTE — Telephone Encounter (Signed)
Patient had called and left a message inquiring about bone scan results. Per Dr. Alen Blew scan was negative and patient was notified by Seth Bake. I did return call and left a message, that I was happy for a good report and asked him to call me if he has further questions or concerns.

## 2019-06-23 DIAGNOSIS — Z5111 Encounter for antineoplastic chemotherapy: Secondary | ICD-10-CM | POA: Diagnosis not present

## 2019-06-23 DIAGNOSIS — C61 Malignant neoplasm of prostate: Secondary | ICD-10-CM | POA: Diagnosis not present

## 2019-06-26 ENCOUNTER — Encounter: Payer: Self-pay | Admitting: Medical Oncology

## 2019-07-23 ENCOUNTER — Telehealth: Payer: Self-pay | Admitting: *Deleted

## 2019-07-23 NOTE — Telephone Encounter (Signed)
CALLED PATIENT TO INFORM OF FID. MARKERS AND SPACE OAR BEING PLACED ON 08-05-19 @ [facility] AND HIS SIM ON 08-08-19- ARRIVALTIME- 10:45 AM @ [place] , SPOKE WITH PATIENT AND HE IS AWARE OF THESE APPTS.

## 2019-08-05 ENCOUNTER — Other Ambulatory Visit: Payer: Self-pay | Admitting: Urology

## 2019-08-05 DIAGNOSIS — C61 Malignant neoplasm of prostate: Secondary | ICD-10-CM | POA: Diagnosis not present

## 2019-08-07 ENCOUNTER — Telehealth: Payer: Self-pay | Admitting: Medical Oncology

## 2019-08-07 ENCOUNTER — Telehealth: Payer: Self-pay | Admitting: *Deleted

## 2019-08-07 NOTE — Telephone Encounter (Signed)
Patient called stating he is upset because our office has not gotten MRI approved. He spoke with his insurance company and they informed him they cannot approve because our office has failed to give them detailed information why MRI is needed. Claim number is [id number] and number [phone]. I will follow up with [facility] and return his call.

## 2019-08-07 NOTE — Telephone Encounter (Signed)
Spoke with Ailene Ravel Hobgood regarding approval for MRI for SpaceOAR gel. She has filed for approval of MRI and she is waiting to hear back from [employer]. She is aware MRI is scheduled for 9/4 and will make sure it is approved before patient's appointment. I called Mr. Youngers to let him know that Ailene Ravel is working with the insurance company to get approval. He is upset and is convinced that we have dropped the ball because that is what [employer] has implied. I assured him that we can not do the MRI without approval and  we are working diligently to get approval. He remains upset and I asked him to speak with Ailene Ravel.

## 2019-08-07 NOTE — Telephone Encounter (Signed)
CALLED PATIENT TO INFORM OF SIM BEING MOVED TO 08-15-19 @ 3 PM @ DR. MANNING'S OFFICE, SPOKE WITH PATIENT AND HE IS AWARE OF THIS BEING MOVED AND WHY IT IS BEING MOVED

## 2019-08-08 ENCOUNTER — Ambulatory Visit: Payer: Medicare HMO | Admitting: Radiation Oncology

## 2019-08-12 ENCOUNTER — Telehealth: Payer: Self-pay | Admitting: *Deleted

## 2019-08-12 NOTE — Telephone Encounter (Signed)
Called patient to inform of MRI for 08-19-19 - arrival time- 6:30 am @ [facility], no restrictions to test, lvm for a return call

## 2019-08-13 ENCOUNTER — Telehealth: Payer: Self-pay | Admitting: *Deleted

## 2019-08-13 NOTE — Telephone Encounter (Signed)
CALLED PATIENT TO INFORM THAT SIM HAS BEEN MOVED TO 08-20-19 @ 11 AM PER ASHLYN BRUNING REQUEST, SPOKE WITH PATIENT AND HE IS AWARE OF THIS APPT. AND IS GOOD WITH IT

## 2019-08-15 ENCOUNTER — Ambulatory Visit: Payer: Medicare HMO | Admitting: Radiation Oncology

## 2019-08-18 ENCOUNTER — Other Ambulatory Visit: Payer: Self-pay

## 2019-08-18 ENCOUNTER — Emergency Department (HOSPITAL_COMMUNITY)
Admission: EM | Admit: 2019-08-18 | Discharge: 2019-08-18 | Disposition: A | Discharge status: Home / Self Care | Payer: Medicare HMO | Attending: Emergency Medicine | Admitting: Emergency Medicine

## 2019-08-18 ENCOUNTER — Encounter (HOSPITAL_COMMUNITY): Payer: Self-pay | Admitting: Emergency Medicine

## 2019-08-18 DIAGNOSIS — N401 Enlarged prostate with lower urinary tract symptoms: Secondary | ICD-10-CM | POA: Insufficient documentation

## 2019-08-18 DIAGNOSIS — Z8546 Personal history of malignant neoplasm of prostate: Secondary | ICD-10-CM | POA: Insufficient documentation

## 2019-08-18 DIAGNOSIS — R310 Gross hematuria: Secondary | ICD-10-CM | POA: Diagnosis not present

## 2019-08-18 DIAGNOSIS — R319 Hematuria, unspecified: Secondary | ICD-10-CM | POA: Diagnosis present

## 2019-08-18 LAB — BASIC METABOLIC PANEL
Anion gap: 10 (ref 5–15)
BUN: 27 mg/dL — ABNORMAL HIGH (ref 8–23)
CO2: 22 mmol/L (ref 22–32)
Calcium: 9 mg/dL (ref 8.9–10.3)
Chloride: 106 mmol/L (ref 98–111)
Creatinine, Ser: 0.97 mg/dL (ref 0.61–1.24)
GFR calc Af Amer: >60 mL/min (ref >60)
GFR calc non Af Amer: >60 mL/min (ref >60)
Glucose, Bld: 93 mg/dL (ref 70–99)
Potassium: 4 mmol/L (ref 3.5–5.1)
Sodium: 138 mmol/L (ref 135–145)

## 2019-08-18 LAB — CBC WITH DIFFERENTIAL/PLATELET
Abs Immature Granulocytes: 0.05 10*3/uL (ref 0.00–0.07)
Basophils Absolute: 0 10*3/uL (ref 0.0–0.1)
Basophils Relative: 0 %
Eosinophils Absolute: 0.1 10*3/uL (ref 0.0–0.5)
Eosinophils Relative: 1 %
HCT: 42.4 % (ref 39.0–52.0)
Hemoglobin: 14 g/dL (ref 13.0–17.0)
Immature Granulocytes: 1 %
Lymphocytes Relative: 6 %
Lymphs Abs: 0.7 10*3/uL (ref 0.7–4.0)
MCH: 28.6 pg (ref 26.0–34.0)
MCHC: 33 g/dL (ref 30.0–36.0)
MCV: 86.5 fL (ref 80.0–100.0)
Monocytes Absolute: 0.5 10*3/uL (ref 0.1–1.0)
Monocytes Relative: 5 %
Neutro Abs: 9.1 10*3/uL — ABNORMAL HIGH (ref 1.7–7.7)
Neutrophils Relative %: 87 %
Platelets: 175 10*3/uL (ref 150–400)
RBC: 4.9 MIL/uL (ref 4.22–5.81)
RDW: 13.5 % (ref 11.5–15.5)
WBC: 10.4 10*3/uL (ref 4.0–10.5)
nRBC: 0 % (ref 0.0–0.2)

## 2019-08-18 LAB — URINALYSIS, ROUTINE W REFLEX MICROSCOPIC
Bilirubin Urine: NEGATIVE
Glucose, UA: NEGATIVE mg/dL
Ketones, ur: 5 mg/dL — AB
Nitrite: NEGATIVE
Protein, ur: >=300 mg/dL — AB
RBC / HPF: >50 RBC/hpf — ABNORMAL HIGH (ref 0–5)
Specific Gravity, Urine: 1.025 (ref 1.005–1.030)
WBC, UA: >50 WBC/hpf — ABNORMAL HIGH (ref 0–5)
pH: 6 (ref 5.0–8.0)

## 2019-08-18 MED ORDER — CEFDINIR 300 MG PO CAPS: 300.0000 mg | ORAL_CAPSULE | Freq: Two times a day (BID) | ORAL | 0 refills | Status: AC

## 2019-08-18 MED ORDER — SODIUM CHLORIDE 0.9 % IV BOLUS
500.0000 mL | Freq: Once | INTRAVENOUS | Status: AC
  Administered 2019-08-18: 15:00:00 500 mL via INTRAVENOUS

## 2019-08-18 MED ORDER — SODIUM CHLORIDE 0.9 % IV SOLN
1.0000 g | Freq: Once | INTRAVENOUS | Status: AC
  Administered 2019-08-18: 1 g via INTRAVENOUS
  Filled 2019-08-18: qty 10

## 2019-08-18 NOTE — ED Notes (Signed)
Provided patient water with permission from Dr. Kirby Funk.

## 2019-08-18 NOTE — ED Provider Notes (Signed)
[facility] DEPT Provider Note   CSN: [id number] Arrival date & time: 08/18/19  1241     History   Chief Complaint Chief Complaint  Patient presents with  . Hematuria    HPI Bob Larson is a 73 y.o. male.     HPI Patient presents 2 weeks after having placement of brachii therapy seeds, for prostate lesion. He notes that he has been covering well, until today, when about 2 hours prior to ED arrival he noticed a sense of urinary urge. This persisted, without any urine production, was somewhat worse with sitting rather than standing, and patient was unable to urinate until he eventually was able to urinate a blood clot, just prior to ED arrival. Subsequently he has been able to urinate, bloody urine, without clots. There was some abdominal discomfort previously, but none currently, nor any other complaints right now.  Past Medical History:  Diagnosis Date  . BPH (benign prostatic hyperplasia)   . Cancer of skin, squamous cell    located on head  . Collagenous colitis   . Elevated PSA   . GERD (gastroesophageal reflux disease)   . H/O colonoscopy 2015   Dr. Collene Mares  . Hernia, inguinal, bilateral   . Hx of vasectomy 1984   Dr. Joelyn Oms  . IBS (irritable bowel syndrome)   . Nephrolith   . Prostatitis   . S/P TURP (status post transurethral resection of prostate) 2016   Dr. Thomasene Mohair    Patient Active Problem List   Diagnosis Date Noted  . Malignant neoplasm of prostate ([place]) 06/06/2019  . Other idiopathic scoliosis, thoracolumbar region 09/19/2017  . Onychomycosis of left great toe 09/19/2017  . BPH with elevated PSA 01/11/2017  . Collagenous colitis 01/11/2017  . Varicose vein of leg 01/11/2017  . Anxiety state 01/11/2017    History reviewed. No pertinent surgical history.      Home Medications    Prior to Admission medications   Medication Sig Start Date End Date Taking? Authorizing Provider  budesonide (ENTOCORT EC) 3 MG 24  hr capsule Take 9 mg by mouth as needed.   Yes [provider]  clonazePAM (KLONOPIN) 1 MG tablet Take 1/2 to 1 tablet by mouth 3 times daily as needed Patient taking differently: Take 1 mg by mouth 3 (three) times daily as needed for anxiety.  09/19/17  Yes Gildardo Cranker, DO  Multiple Vitamin (MULTIVITAMIN) capsule Take 1 capsule by mouth daily.   Yes [provider]    Family History Family History  Problem Relation Age of Onset  . AAA (abdominal aortic aneurysm) Father 26  . Pneumonia Mother 33  . Kidney failure Brother   . Pneumonia Brother   . Heart attack Brother 51    Social History Social History   Tobacco Use  . Smoking status: Never Smoker  . Smokeless tobacco: Never Used  Substance Use Topics  . Alcohol use: No    Alcohol/week: 0.0 standard drinks  . Drug use: No     Allergies   Penicillins   Review of Systems Review of Systems  Constitutional:       Per HPI, otherwise negative  HENT:       Per HPI, otherwise negative  Respiratory:       Per HPI, otherwise negative  Cardiovascular:       Per HPI, otherwise negative  Gastrointestinal: Negative for vomiting.  Endocrine:       Negative aside from HPI  Genitourinary:  Neg aside from HPI   Musculoskeletal:       Per HPI, otherwise negative  Skin: Negative.   Allergic/Immunologic: Positive for immunocompromised state.  Neurological: Negative for syncope.     Physical Exam Updated Vital Signs BP 120/83   Pulse (!) 57   Temp 98.5 F (36.9 C) (Oral)   Resp 18   SpO2 96%   Physical Exam Vitals signs and nursing note reviewed.  Constitutional:      General: He is not in acute distress.    Appearance: He is well-developed.  HENT:     Head: Normocephalic and atraumatic.  Eyes:     Conjunctiva/sclera: Conjunctivae normal.  Cardiovascular:     Rate and Rhythm: Normal rate and regular rhythm.  Pulmonary:     Effort: Pulmonary effort is normal. No respiratory distress.      Breath sounds: No stridor.  Abdominal:     General: There is no distension.     Tenderness: There is no abdominal tenderness. There is no guarding.  Skin:    General: Skin is warm and dry.  Neurological:     Mental Status: He is alert and oriented to person, place, and time.      ED Treatments / Results  Labs (all labs ordered are listed, but only abnormal results are displayed) Labs Reviewed  URINALYSIS, ROUTINE W REFLEX MICROSCOPIC - Abnormal; Notable for the following components:      Result Value   APPearance TURBID (*)    Hgb urine dipstick LARGE (*)    Ketones, ur 5 (*)    Protein, ur >=300 (*)    Leukocytes,Ua LARGE (*)    RBC / HPF >50 (*)    WBC, UA >50 (*)    Bacteria, UA FEW (*)    All other components within normal limits  BASIC METABOLIC PANEL - Abnormal; Notable for the following components:   BUN 27 (*)    All other components within normal limits  CBC WITH DIFFERENTIAL/PLATELET - Abnormal; Notable for the following components:   Neutro Abs 9.1 (*)    All other components within normal limits    EKG None  Radiology No results found.  Procedures Procedures (including critical care time)  Medications Ordered in ED Medications  cefTRIAXone (ROCEPHIN) 1 g in sodium chloride 0.9 % 100 mL IVPB (has no administration in time range)  sodium chloride 0.9 % bolus 500 mL (500 mLs Intravenous New Bag/Given (Non-Interop) 08/18/19 1437)     Initial Impression / Assessment and Plan / ED Course  I have reviewed the triage vital signs and the nursing notes.  Pertinent labs & imaging results that were available during my care of the patient were reviewed by me and considered in my medical decision making (see chart for details).        3:05 PM Patient is awake alert, ambulatory.  Any speaking clearly. Vital signs unremarkable. He has urinated once, is attempting to do so again. Discussed findings thus far with our urology colleagues, given concern for  difficulty with urination, clot production and urinalysis consistent with contamination versus infection per Patient is awake and alert, afebrile, has no leukocytosis, no evidence for bacteremia, sepsis per However, given his recent procedure, cancer, patient will have a dose of antibiotics here, be discharged with oral therapy, to follow-up in the clinic.  Final Clinical Impressions(s) / ED Diagnoses   Final diagnoses:  Gross hematuria    ED Discharge Orders  Ordered    cefdinir (OMNICEF) 300 MG capsule  2 times daily     08/18/19 1506           Carmin Muskrat, MD 08/18/19 1528

## 2019-08-18 NOTE — ED Triage Notes (Signed)
Patient reports burning and urinating blood with clots x2 hours. States implants placed to treat prostate cancer x2 weeks ago. C/o lower abdominal pain.

## 2019-08-18 NOTE — Discharge Instructions (Signed)
As discussed, it is important to keep your appointment tomorrow for MRI, as well as follow-up with your urologist and oncologist this week.  Take all medication as directed, stay well-hydrated.  Return here for any concerning changes in your condition.

## 2019-08-19 ENCOUNTER — Ambulatory Visit (HOSPITAL_COMMUNITY)
Admission: RE | Admit: 2019-08-19 | Discharge: 2019-08-19 | Disposition: A | Discharge status: Home / Self Care | Payer: Medicare HMO | Source: Ambulatory Visit | Attending: Urology | Admitting: Urology

## 2019-08-19 ENCOUNTER — Other Ambulatory Visit: Payer: Self-pay

## 2019-08-19 DIAGNOSIS — C61 Malignant neoplasm of prostate: Secondary | ICD-10-CM

## 2019-08-19 NOTE — Progress Notes (Signed)
  Radiation Oncology         (336) [phone] ________________________________  Name: Bob Larson MRN: [id number]  Date: 08/20/2019  DOB: Jul 13, 1946  SIMULATION AND TREATMENT PLANNING NOTE    ICD-10-CM   1. Malignant neoplasm of prostate ([place])  C61     DIAGNOSIS:  73 y.o. gentleman with stage T3a, locally advanced adenocarcinoma of the prostate with a Gleason's score of 3+4 and a PSA of 12.3  NARRATIVE:  The patient was brought to the [facility].  Identity was confirmed.  All relevant records and images related to the planned course of therapy were reviewed.  The patient freely provided informed written consent to proceed with treatment after reviewing the details related to the planned course of therapy. The consent form was witnessed and verified by the simulation staff.  Then, the patient was set-up in a stable reproducible supine position for radiation therapy.  A vacuum lock pillow device was custom fabricated to position his legs in a reproducible immobilized position.  Then, I performed a urethrogram under sterile conditions to identify the prostatic apex.  CT images were obtained.  Surface markings were placed.  The CT images were loaded into the planning software.  Then the prostate target and avoidance structures including the rectum, bladder, bowel and hips were contoured.  Treatment planning then occurred.  The radiation prescription was entered and confirmed.  A total of one complex treatment devices were fabricated. I have requested : Intensity Modulated Radiotherapy (IMRT) is medically necessary for this case for the following reason:  Rectal sparing.Marland Kitchen  PLAN:  The patient will receive 45 Gy in 25 fractions of 1.8 Gy, followed by a boost to the prostate to a total dose of 75 Gy with 15 additional fractions of 2.0 Gy.  ________________________________  Sheral Apley Tammi Klippel, M.D.

## 2019-08-20 ENCOUNTER — Other Ambulatory Visit: Payer: Self-pay

## 2019-08-20 ENCOUNTER — Ambulatory Visit
Admission: RE | Admit: 2019-08-20 | Discharge: 2019-08-20 | Disposition: A | Discharge status: Home / Self Care | Payer: Medicare HMO | Source: Ambulatory Visit | Attending: Radiation Oncology | Admitting: Radiation Oncology

## 2019-08-20 DIAGNOSIS — Z510 Encounter for antineoplastic radiation therapy: Secondary | ICD-10-CM | POA: Diagnosis not present

## 2019-08-20 DIAGNOSIS — C61 Malignant neoplasm of prostate: Secondary | ICD-10-CM | POA: Diagnosis not present

## 2019-08-22 DIAGNOSIS — C61 Malignant neoplasm of prostate: Secondary | ICD-10-CM | POA: Diagnosis not present

## 2019-08-22 DIAGNOSIS — Z510 Encounter for antineoplastic radiation therapy: Secondary | ICD-10-CM | POA: Diagnosis not present

## 2019-08-27 ENCOUNTER — Encounter: Payer: Self-pay | Admitting: Medical Oncology

## 2019-09-01 ENCOUNTER — Encounter: Payer: Self-pay | Admitting: Medical Oncology

## 2019-09-01 ENCOUNTER — Other Ambulatory Visit: Payer: Self-pay

## 2019-09-01 ENCOUNTER — Ambulatory Visit
Admission: RE | Admit: 2019-09-01 | Discharge: 2019-09-01 | Disposition: A | Discharge status: Home / Self Care | Payer: Medicare HMO | Source: Ambulatory Visit | Attending: Radiation Oncology | Admitting: Radiation Oncology

## 2019-09-01 DIAGNOSIS — Z510 Encounter for antineoplastic radiation therapy: Secondary | ICD-10-CM | POA: Diagnosis not present

## 2019-09-01 DIAGNOSIS — C61 Malignant neoplasm of prostate: Secondary | ICD-10-CM | POA: Diagnosis not present

## 2019-09-02 ENCOUNTER — Ambulatory Visit
Admission: RE | Admit: 2019-09-02 | Discharge: 2019-09-02 | Disposition: A | Discharge status: Home / Self Care | Payer: Medicare HMO | Source: Ambulatory Visit | Attending: Radiation Oncology | Admitting: Radiation Oncology

## 2019-09-02 DIAGNOSIS — Z510 Encounter for antineoplastic radiation therapy: Secondary | ICD-10-CM | POA: Diagnosis not present

## 2019-09-02 DIAGNOSIS — C61 Malignant neoplasm of prostate: Secondary | ICD-10-CM | POA: Diagnosis not present

## 2019-09-03 ENCOUNTER — Ambulatory Visit
Admission: RE | Admit: 2019-09-03 | Discharge: 2019-09-03 | Disposition: A | Discharge status: Home / Self Care | Payer: Medicare HMO | Source: Ambulatory Visit | Attending: Radiation Oncology | Admitting: Radiation Oncology

## 2019-09-03 ENCOUNTER — Other Ambulatory Visit: Payer: Self-pay

## 2019-09-03 DIAGNOSIS — Z510 Encounter for antineoplastic radiation therapy: Secondary | ICD-10-CM | POA: Diagnosis not present

## 2019-09-03 DIAGNOSIS — C61 Malignant neoplasm of prostate: Secondary | ICD-10-CM | POA: Diagnosis not present

## 2019-09-04 ENCOUNTER — Ambulatory Visit
Admission: RE | Admit: 2019-09-04 | Discharge: 2019-09-04 | Disposition: A | Discharge status: Home / Self Care | Payer: Medicare HMO | Source: Ambulatory Visit | Attending: Radiation Oncology | Admitting: Radiation Oncology

## 2019-09-04 ENCOUNTER — Other Ambulatory Visit: Payer: Self-pay

## 2019-09-04 DIAGNOSIS — C61 Malignant neoplasm of prostate: Secondary | ICD-10-CM | POA: Diagnosis not present

## 2019-09-04 DIAGNOSIS — Z510 Encounter for antineoplastic radiation therapy: Secondary | ICD-10-CM | POA: Diagnosis not present

## 2019-09-05 ENCOUNTER — Other Ambulatory Visit: Payer: Self-pay

## 2019-09-05 ENCOUNTER — Ambulatory Visit
Admission: RE | Admit: 2019-09-05 | Discharge: 2019-09-05 | Disposition: A | Discharge status: Home / Self Care | Payer: Medicare HMO | Source: Ambulatory Visit | Attending: Radiation Oncology | Admitting: Radiation Oncology

## 2019-09-05 DIAGNOSIS — Z510 Encounter for antineoplastic radiation therapy: Secondary | ICD-10-CM | POA: Diagnosis not present

## 2019-09-05 DIAGNOSIS — C61 Malignant neoplasm of prostate: Secondary | ICD-10-CM | POA: Diagnosis not present

## 2019-09-07 NOTE — Progress Notes (Deleted)
  Radiation Oncology         (336) [phone] ________________________________  Name: Bob Larson MRN: [id number]  Date: 08/20/2019  DOB: 1946-03-30  COMPLEX SIMULATION NOTE  NARRATIVE:  The patient was brought to the [facility] today following prostate seed implantation approximately one month ago.  Identity was confirmed.  All relevant records and images related to the planned course of therapy were reviewed.  Then, the patient was set-up supine.  CT images were obtained.  The CT images were loaded into the planning software.  Then the prostate and rectum were contoured.  Treatment planning then occurred.  The implanted iodine 125 seeds were identified by the physics staff for projection of radiation distribution  I have requested : 3D Simulation  I have requested a DVH of the following structures: Prostate and rectum.    ________________________________  Sheral Apley Tammi Klippel, M.D.

## 2019-09-07 NOTE — Addendum Note (Signed)
Encounter addended by: Tyler Pita, MD on: 09/07/2019 5:56 PM  Actions taken: Clinical Note Signed, Delete clinical note

## 2019-09-08 ENCOUNTER — Encounter: Payer: Self-pay | Admitting: Medical Oncology

## 2019-09-08 ENCOUNTER — Ambulatory Visit
Admission: RE | Admit: 2019-09-08 | Discharge: 2019-09-08 | Disposition: A | Discharge status: Home / Self Care | Payer: Medicare HMO | Source: Ambulatory Visit | Attending: Radiation Oncology | Admitting: Radiation Oncology

## 2019-09-08 ENCOUNTER — Other Ambulatory Visit: Payer: Self-pay

## 2019-09-08 ENCOUNTER — Telehealth: Payer: Self-pay | Admitting: Radiation Oncology

## 2019-09-08 DIAGNOSIS — C61 Malignant neoplasm of prostate: Secondary | ICD-10-CM | POA: Diagnosis not present

## 2019-09-08 DIAGNOSIS — Z510 Encounter for antineoplastic radiation therapy: Secondary | ICD-10-CM | POA: Diagnosis not present

## 2019-09-08 NOTE — Telephone Encounter (Signed)
Phoned patient to follow up and offer Flomax script per Dr. Tammi Klippel. Patient explains he has taken Flomax in the past but did not obtain relief using it. Patient went onto explain that after speaking with Dr. Tammi Klippel on Friday he began taking doxazosin that he was previously prescribed. Patient reports an improvement in his ability to empty his bladder but continued urinary frequency. Patient plans to continue doxazosin for one week and discuss outcome with Dr. Tammi Klippel on Friday during his PUT encounter. Patient denies additional needs at this time.

## 2019-09-08 NOTE — Progress Notes (Signed)
Patient states his is doing well with radiation. He has noted an increase in frequency but no pain.

## 2019-09-08 NOTE — Telephone Encounter (Signed)
-----   Message from Tyler Pita, MD sent at 09/05/2019 12:58 PM EDT ----- Regarding: RE: My Bladder Is Running My Life I tell him that I have a solution, and give him Flomax.  We'll see how he responds.  ----- Message ----- From: Freeman Caldron, PA-C Sent: 09/04/2019   5:26 PM EDT To: Tyler Pita, MD, Heywood Footman, RN Subject: RE: My Bladder Is Running My Life              I'm not sure that a lot of his issues are not psychological- tied to increased anxiety with treatments/disease????  LUTS are not a common side effect of ADT, in fact, usually the opposite due to the effect it has in reducing the overall size of the prostate over time. I do recall him mentioning that to me at the time of consent with SIM and I tried to tell him then that I did not think it was related to his ADT but he insists that the issues did not begin until he had the shot... this is a tricky one. -Ash ----- Message ----- From: Heywood Footman, RN Sent: 09/04/2019   4:05 PM EDT To: Tyler Pita, MD, Freeman Caldron, PA-C Subject: My Bladder Is Running My Life                  Ashlyn and Dr. Tammi Klippel.  I did his education today! Wow! He said that since his ADT injection his bladder has "ruled his life." He expressed he has an appointment for a second injection in January and he won't be getting it. He reports being of normal state before the injections. He reports since the injection he has frequency, nocturia x 2 and the sensation of not emptying his bladder completely. He described attempts to manually make himself pass urine that caused him to have a split stream. Patient endorses tolerable hot flashes. Patient denies dysuria, hematuria or UTI like symptoms. He isn't presently taking flomax, myrbetriq, ditropan, etc. He expressed his misery multiple times. I explained he would see Dr. Tammi Klippel tomorrow and could discuss a course of action then but I wanted you to be prepared sir.   Sam

## 2019-09-09 ENCOUNTER — Other Ambulatory Visit: Payer: Self-pay

## 2019-09-09 ENCOUNTER — Ambulatory Visit
Admission: RE | Admit: 2019-09-09 | Discharge: 2019-09-09 | Disposition: A | Discharge status: Home / Self Care | Payer: Medicare HMO | Source: Ambulatory Visit | Attending: Radiation Oncology | Admitting: Radiation Oncology

## 2019-09-09 DIAGNOSIS — C61 Malignant neoplasm of prostate: Secondary | ICD-10-CM | POA: Diagnosis not present

## 2019-09-09 DIAGNOSIS — Z510 Encounter for antineoplastic radiation therapy: Secondary | ICD-10-CM | POA: Diagnosis not present

## 2019-09-10 ENCOUNTER — Other Ambulatory Visit: Payer: Self-pay

## 2019-09-10 ENCOUNTER — Ambulatory Visit
Admission: RE | Admit: 2019-09-10 | Discharge: 2019-09-10 | Disposition: A | Discharge status: Home / Self Care | Payer: Medicare HMO | Source: Ambulatory Visit | Attending: Radiation Oncology | Admitting: Radiation Oncology

## 2019-09-10 DIAGNOSIS — C61 Malignant neoplasm of prostate: Secondary | ICD-10-CM | POA: Diagnosis not present

## 2019-09-10 DIAGNOSIS — Z510 Encounter for antineoplastic radiation therapy: Secondary | ICD-10-CM | POA: Diagnosis not present

## 2019-09-11 ENCOUNTER — Other Ambulatory Visit: Payer: Self-pay

## 2019-09-11 ENCOUNTER — Ambulatory Visit
Admission: RE | Admit: 2019-09-11 | Discharge: 2019-09-11 | Disposition: A | Discharge status: Home / Self Care | Payer: Medicare HMO | Source: Ambulatory Visit | Attending: Radiation Oncology | Admitting: Radiation Oncology

## 2019-09-11 DIAGNOSIS — Z510 Encounter for antineoplastic radiation therapy: Secondary | ICD-10-CM | POA: Insufficient documentation

## 2019-09-11 DIAGNOSIS — C61 Malignant neoplasm of prostate: Secondary | ICD-10-CM | POA: Diagnosis not present

## 2019-09-12 ENCOUNTER — Ambulatory Visit
Admission: RE | Admit: 2019-09-12 | Discharge: 2019-09-12 | Disposition: A | Discharge status: Home / Self Care | Payer: Medicare HMO | Source: Ambulatory Visit | Attending: Radiation Oncology | Admitting: Radiation Oncology

## 2019-09-12 ENCOUNTER — Other Ambulatory Visit: Payer: Self-pay

## 2019-09-12 DIAGNOSIS — C61 Malignant neoplasm of prostate: Secondary | ICD-10-CM | POA: Diagnosis not present

## 2019-09-12 DIAGNOSIS — Z510 Encounter for antineoplastic radiation therapy: Secondary | ICD-10-CM | POA: Diagnosis not present

## 2019-09-15 ENCOUNTER — Other Ambulatory Visit: Payer: Self-pay

## 2019-09-15 ENCOUNTER — Ambulatory Visit
Admission: RE | Admit: 2019-09-15 | Discharge: 2019-09-15 | Disposition: A | Discharge status: Home / Self Care | Payer: Medicare HMO | Source: Ambulatory Visit | Attending: Radiation Oncology | Admitting: Radiation Oncology

## 2019-09-15 DIAGNOSIS — Z510 Encounter for antineoplastic radiation therapy: Secondary | ICD-10-CM | POA: Diagnosis not present

## 2019-09-15 DIAGNOSIS — C61 Malignant neoplasm of prostate: Secondary | ICD-10-CM | POA: Diagnosis not present

## 2019-09-16 ENCOUNTER — Ambulatory Visit
Admission: RE | Admit: 2019-09-16 | Discharge: 2019-09-16 | Disposition: A | Discharge status: Home / Self Care | Payer: Medicare HMO | Source: Ambulatory Visit | Attending: Radiation Oncology | Admitting: Radiation Oncology

## 2019-09-16 ENCOUNTER — Other Ambulatory Visit: Payer: Self-pay

## 2019-09-16 DIAGNOSIS — Z510 Encounter for antineoplastic radiation therapy: Secondary | ICD-10-CM | POA: Diagnosis not present

## 2019-09-16 DIAGNOSIS — C61 Malignant neoplasm of prostate: Secondary | ICD-10-CM | POA: Diagnosis not present

## 2019-09-17 ENCOUNTER — Ambulatory Visit
Admission: RE | Admit: 2019-09-17 | Discharge: 2019-09-17 | Disposition: A | Discharge status: Home / Self Care | Payer: Medicare HMO | Source: Ambulatory Visit | Attending: Radiation Oncology | Admitting: Radiation Oncology

## 2019-09-17 ENCOUNTER — Other Ambulatory Visit: Payer: Self-pay

## 2019-09-17 DIAGNOSIS — Z510 Encounter for antineoplastic radiation therapy: Secondary | ICD-10-CM | POA: Diagnosis not present

## 2019-09-17 DIAGNOSIS — C61 Malignant neoplasm of prostate: Secondary | ICD-10-CM | POA: Diagnosis not present

## 2019-09-18 ENCOUNTER — Ambulatory Visit
Admission: RE | Admit: 2019-09-18 | Discharge: 2019-09-18 | Disposition: A | Discharge status: Home / Self Care | Payer: Medicare HMO | Source: Ambulatory Visit | Attending: Radiation Oncology | Admitting: Radiation Oncology

## 2019-09-18 ENCOUNTER — Other Ambulatory Visit: Payer: Self-pay

## 2019-09-18 DIAGNOSIS — Z510 Encounter for antineoplastic radiation therapy: Secondary | ICD-10-CM | POA: Diagnosis not present

## 2019-09-18 DIAGNOSIS — C61 Malignant neoplasm of prostate: Secondary | ICD-10-CM | POA: Diagnosis not present

## 2019-09-19 ENCOUNTER — Ambulatory Visit
Admission: RE | Admit: 2019-09-19 | Discharge: 2019-09-19 | Disposition: A | Discharge status: Home / Self Care | Payer: Medicare HMO | Source: Ambulatory Visit | Attending: Radiation Oncology | Admitting: Radiation Oncology

## 2019-09-19 ENCOUNTER — Other Ambulatory Visit: Payer: Self-pay

## 2019-09-19 DIAGNOSIS — Z510 Encounter for antineoplastic radiation therapy: Secondary | ICD-10-CM | POA: Diagnosis not present

## 2019-09-19 DIAGNOSIS — C61 Malignant neoplasm of prostate: Secondary | ICD-10-CM | POA: Diagnosis not present

## 2019-09-22 ENCOUNTER — Ambulatory Visit
Admission: RE | Admit: 2019-09-22 | Discharge: 2019-09-22 | Disposition: A | Discharge status: Home / Self Care | Payer: Medicare HMO | Source: Ambulatory Visit | Attending: Radiation Oncology | Admitting: Radiation Oncology

## 2019-09-22 ENCOUNTER — Other Ambulatory Visit: Payer: Self-pay

## 2019-09-22 DIAGNOSIS — C61 Malignant neoplasm of prostate: Secondary | ICD-10-CM | POA: Diagnosis not present

## 2019-09-22 DIAGNOSIS — Z510 Encounter for antineoplastic radiation therapy: Secondary | ICD-10-CM | POA: Diagnosis not present

## 2019-09-23 ENCOUNTER — Ambulatory Visit
Admission: RE | Admit: 2019-09-23 | Discharge: 2019-09-23 | Disposition: A | Discharge status: Home / Self Care | Payer: Medicare HMO | Source: Ambulatory Visit | Attending: Radiation Oncology | Admitting: Radiation Oncology

## 2019-09-23 ENCOUNTER — Other Ambulatory Visit: Payer: Self-pay

## 2019-09-23 DIAGNOSIS — C61 Malignant neoplasm of prostate: Secondary | ICD-10-CM | POA: Diagnosis not present

## 2019-09-23 DIAGNOSIS — Z510 Encounter for antineoplastic radiation therapy: Secondary | ICD-10-CM | POA: Diagnosis not present

## 2019-09-24 ENCOUNTER — Other Ambulatory Visit: Payer: Self-pay

## 2019-09-24 ENCOUNTER — Ambulatory Visit
Admission: RE | Admit: 2019-09-24 | Discharge: 2019-09-24 | Disposition: A | Discharge status: Home / Self Care | Payer: Medicare HMO | Source: Ambulatory Visit | Attending: Radiation Oncology | Admitting: Radiation Oncology

## 2019-09-24 DIAGNOSIS — C61 Malignant neoplasm of prostate: Secondary | ICD-10-CM | POA: Diagnosis not present

## 2019-09-24 DIAGNOSIS — Z510 Encounter for antineoplastic radiation therapy: Secondary | ICD-10-CM | POA: Diagnosis not present

## 2019-09-25 ENCOUNTER — Other Ambulatory Visit: Payer: Self-pay

## 2019-09-25 ENCOUNTER — Ambulatory Visit
Admission: RE | Admit: 2019-09-25 | Discharge: 2019-09-25 | Disposition: A | Discharge status: Home / Self Care | Payer: Medicare HMO | Source: Ambulatory Visit | Attending: Radiation Oncology | Admitting: Radiation Oncology

## 2019-09-25 DIAGNOSIS — Z510 Encounter for antineoplastic radiation therapy: Secondary | ICD-10-CM | POA: Diagnosis not present

## 2019-09-25 DIAGNOSIS — C61 Malignant neoplasm of prostate: Secondary | ICD-10-CM | POA: Diagnosis not present

## 2019-09-26 ENCOUNTER — Ambulatory Visit
Admission: RE | Admit: 2019-09-26 | Discharge: 2019-09-26 | Disposition: A | Discharge status: Home / Self Care | Payer: Medicare HMO | Source: Ambulatory Visit | Attending: Radiation Oncology | Admitting: Radiation Oncology

## 2019-09-26 ENCOUNTER — Other Ambulatory Visit: Payer: Self-pay

## 2019-09-26 DIAGNOSIS — Z510 Encounter for antineoplastic radiation therapy: Secondary | ICD-10-CM | POA: Diagnosis not present

## 2019-09-26 DIAGNOSIS — C61 Malignant neoplasm of prostate: Secondary | ICD-10-CM | POA: Diagnosis not present

## 2019-09-29 ENCOUNTER — Other Ambulatory Visit: Payer: Self-pay

## 2019-09-29 ENCOUNTER — Ambulatory Visit
Admission: RE | Admit: 2019-09-29 | Discharge: 2019-09-29 | Disposition: A | Discharge status: Home / Self Care | Payer: Medicare HMO | Source: Ambulatory Visit | Attending: Radiation Oncology | Admitting: Radiation Oncology

## 2019-09-29 DIAGNOSIS — Z510 Encounter for antineoplastic radiation therapy: Secondary | ICD-10-CM | POA: Diagnosis not present

## 2019-09-29 DIAGNOSIS — C61 Malignant neoplasm of prostate: Secondary | ICD-10-CM | POA: Diagnosis not present

## 2019-09-30 ENCOUNTER — Ambulatory Visit
Admission: RE | Admit: 2019-09-30 | Discharge: 2019-09-30 | Disposition: A | Discharge status: Home / Self Care | Payer: Medicare HMO | Source: Ambulatory Visit | Attending: Radiation Oncology | Admitting: Radiation Oncology

## 2019-09-30 ENCOUNTER — Other Ambulatory Visit: Payer: Self-pay

## 2019-09-30 DIAGNOSIS — M792 Neuralgia and neuritis, unspecified: Secondary | ICD-10-CM | POA: Diagnosis not present

## 2019-09-30 DIAGNOSIS — L570 Actinic keratosis: Secondary | ICD-10-CM | POA: Diagnosis not present

## 2019-09-30 DIAGNOSIS — C61 Malignant neoplasm of prostate: Secondary | ICD-10-CM | POA: Diagnosis not present

## 2019-09-30 DIAGNOSIS — X32XXXD Exposure to sunlight, subsequent encounter: Secondary | ICD-10-CM | POA: Diagnosis not present

## 2019-09-30 DIAGNOSIS — Z510 Encounter for antineoplastic radiation therapy: Secondary | ICD-10-CM | POA: Diagnosis not present

## 2019-10-01 ENCOUNTER — Ambulatory Visit
Admission: RE | Admit: 2019-10-01 | Discharge: 2019-10-01 | Disposition: A | Discharge status: Home / Self Care | Payer: Medicare HMO | Source: Ambulatory Visit | Attending: Radiation Oncology | Admitting: Radiation Oncology

## 2019-10-01 ENCOUNTER — Other Ambulatory Visit: Payer: Self-pay

## 2019-10-01 DIAGNOSIS — Z510 Encounter for antineoplastic radiation therapy: Secondary | ICD-10-CM | POA: Diagnosis not present

## 2019-10-01 DIAGNOSIS — C61 Malignant neoplasm of prostate: Secondary | ICD-10-CM | POA: Diagnosis not present

## 2019-10-02 ENCOUNTER — Other Ambulatory Visit: Payer: Self-pay

## 2019-10-02 ENCOUNTER — Ambulatory Visit
Admission: RE | Admit: 2019-10-02 | Discharge: 2019-10-02 | Disposition: A | Discharge status: Home / Self Care | Payer: Medicare HMO | Source: Ambulatory Visit | Attending: Radiation Oncology | Admitting: Radiation Oncology

## 2019-10-02 DIAGNOSIS — C61 Malignant neoplasm of prostate: Secondary | ICD-10-CM | POA: Diagnosis not present

## 2019-10-02 DIAGNOSIS — Z510 Encounter for antineoplastic radiation therapy: Secondary | ICD-10-CM | POA: Diagnosis not present

## 2019-10-03 ENCOUNTER — Ambulatory Visit
Admission: RE | Admit: 2019-10-03 | Discharge: 2019-10-03 | Disposition: A | Discharge status: Home / Self Care | Payer: Medicare HMO | Source: Ambulatory Visit | Attending: Radiation Oncology | Admitting: Radiation Oncology

## 2019-10-03 ENCOUNTER — Other Ambulatory Visit: Payer: Self-pay

## 2019-10-03 DIAGNOSIS — Z510 Encounter for antineoplastic radiation therapy: Secondary | ICD-10-CM | POA: Diagnosis not present

## 2019-10-03 DIAGNOSIS — C61 Malignant neoplasm of prostate: Secondary | ICD-10-CM | POA: Diagnosis not present

## 2019-10-06 ENCOUNTER — Ambulatory Visit
Admission: RE | Admit: 2019-10-06 | Discharge: 2019-10-06 | Disposition: A | Discharge status: Home / Self Care | Payer: Medicare HMO | Source: Ambulatory Visit | Attending: Radiation Oncology | Admitting: Radiation Oncology

## 2019-10-06 ENCOUNTER — Other Ambulatory Visit: Payer: Self-pay

## 2019-10-06 DIAGNOSIS — C61 Malignant neoplasm of prostate: Secondary | ICD-10-CM | POA: Diagnosis not present

## 2019-10-06 DIAGNOSIS — Z510 Encounter for antineoplastic radiation therapy: Secondary | ICD-10-CM | POA: Diagnosis not present

## 2019-10-07 ENCOUNTER — Other Ambulatory Visit: Payer: Self-pay

## 2019-10-07 ENCOUNTER — Ambulatory Visit
Admission: RE | Admit: 2019-10-07 | Discharge: 2019-10-07 | Disposition: A | Discharge status: Home / Self Care | Payer: Medicare HMO | Source: Ambulatory Visit | Attending: Radiation Oncology | Admitting: Radiation Oncology

## 2019-10-07 DIAGNOSIS — C61 Malignant neoplasm of prostate: Secondary | ICD-10-CM | POA: Diagnosis not present

## 2019-10-07 DIAGNOSIS — Z510 Encounter for antineoplastic radiation therapy: Secondary | ICD-10-CM | POA: Diagnosis not present

## 2019-10-08 ENCOUNTER — Other Ambulatory Visit: Payer: Self-pay

## 2019-10-08 ENCOUNTER — Ambulatory Visit
Admission: RE | Admit: 2019-10-08 | Discharge: 2019-10-08 | Disposition: A | Discharge status: Home / Self Care | Payer: Medicare HMO | Source: Ambulatory Visit | Attending: Radiation Oncology | Admitting: Radiation Oncology

## 2019-10-08 DIAGNOSIS — C61 Malignant neoplasm of prostate: Secondary | ICD-10-CM | POA: Diagnosis not present

## 2019-10-08 DIAGNOSIS — Z510 Encounter for antineoplastic radiation therapy: Secondary | ICD-10-CM | POA: Diagnosis not present

## 2019-10-09 ENCOUNTER — Ambulatory Visit
Admission: RE | Admit: 2019-10-09 | Discharge: 2019-10-09 | Disposition: A | Discharge status: Home / Self Care | Payer: Medicare HMO | Source: Ambulatory Visit | Attending: Radiation Oncology | Admitting: Radiation Oncology

## 2019-10-09 ENCOUNTER — Other Ambulatory Visit: Payer: Self-pay

## 2019-10-09 DIAGNOSIS — C61 Malignant neoplasm of prostate: Secondary | ICD-10-CM | POA: Diagnosis not present

## 2019-10-09 DIAGNOSIS — Z510 Encounter for antineoplastic radiation therapy: Secondary | ICD-10-CM | POA: Diagnosis not present

## 2019-10-10 ENCOUNTER — Other Ambulatory Visit: Payer: Self-pay

## 2019-10-10 ENCOUNTER — Ambulatory Visit
Admission: RE | Admit: 2019-10-10 | Discharge: 2019-10-10 | Disposition: A | Discharge status: Home / Self Care | Payer: Medicare HMO | Source: Ambulatory Visit | Attending: Radiation Oncology | Admitting: Radiation Oncology

## 2019-10-10 DIAGNOSIS — C61 Malignant neoplasm of prostate: Secondary | ICD-10-CM | POA: Diagnosis not present

## 2019-10-10 DIAGNOSIS — Z510 Encounter for antineoplastic radiation therapy: Secondary | ICD-10-CM | POA: Diagnosis not present

## 2019-10-13 ENCOUNTER — Other Ambulatory Visit: Payer: Self-pay

## 2019-10-13 ENCOUNTER — Ambulatory Visit
Admission: RE | Admit: 2019-10-13 | Discharge: 2019-10-13 | Disposition: A | Discharge status: Home / Self Care | Payer: Medicare HMO | Source: Ambulatory Visit | Attending: Radiation Oncology | Admitting: Radiation Oncology

## 2019-10-13 DIAGNOSIS — Z510 Encounter for antineoplastic radiation therapy: Secondary | ICD-10-CM | POA: Insufficient documentation

## 2019-10-13 DIAGNOSIS — C61 Malignant neoplasm of prostate: Secondary | ICD-10-CM | POA: Diagnosis not present

## 2019-10-14 ENCOUNTER — Other Ambulatory Visit: Payer: Self-pay

## 2019-10-14 ENCOUNTER — Ambulatory Visit
Admission: RE | Admit: 2019-10-14 | Discharge: 2019-10-14 | Disposition: A | Discharge status: Home / Self Care | Payer: Medicare HMO | Source: Ambulatory Visit | Attending: Radiation Oncology | Admitting: Radiation Oncology

## 2019-10-14 DIAGNOSIS — C61 Malignant neoplasm of prostate: Secondary | ICD-10-CM | POA: Diagnosis not present

## 2019-10-14 DIAGNOSIS — Z510 Encounter for antineoplastic radiation therapy: Secondary | ICD-10-CM | POA: Diagnosis not present

## 2019-10-15 ENCOUNTER — Ambulatory Visit
Admission: RE | Admit: 2019-10-15 | Discharge: 2019-10-15 | Disposition: A | Discharge status: Home / Self Care | Payer: Medicare HMO | Source: Ambulatory Visit | Attending: Radiation Oncology | Admitting: Radiation Oncology

## 2019-10-15 ENCOUNTER — Other Ambulatory Visit: Payer: Self-pay

## 2019-10-15 DIAGNOSIS — C61 Malignant neoplasm of prostate: Secondary | ICD-10-CM | POA: Diagnosis not present

## 2019-10-15 DIAGNOSIS — Z510 Encounter for antineoplastic radiation therapy: Secondary | ICD-10-CM | POA: Diagnosis not present

## 2019-10-16 ENCOUNTER — Ambulatory Visit
Admission: RE | Admit: 2019-10-16 | Discharge: 2019-10-16 | Disposition: A | Discharge status: Home / Self Care | Payer: Medicare HMO | Source: Ambulatory Visit | Attending: Radiation Oncology | Admitting: Radiation Oncology

## 2019-10-16 ENCOUNTER — Other Ambulatory Visit: Payer: Self-pay

## 2019-10-16 DIAGNOSIS — C61 Malignant neoplasm of prostate: Secondary | ICD-10-CM | POA: Diagnosis not present

## 2019-10-16 DIAGNOSIS — Z510 Encounter for antineoplastic radiation therapy: Secondary | ICD-10-CM | POA: Diagnosis not present

## 2019-10-17 ENCOUNTER — Ambulatory Visit
Admission: RE | Admit: 2019-10-17 | Discharge: 2019-10-17 | Disposition: A | Discharge status: Home / Self Care | Payer: Medicare HMO | Source: Ambulatory Visit | Attending: Radiation Oncology | Admitting: Radiation Oncology

## 2019-10-17 ENCOUNTER — Other Ambulatory Visit: Payer: Self-pay

## 2019-10-17 DIAGNOSIS — Z510 Encounter for antineoplastic radiation therapy: Secondary | ICD-10-CM | POA: Diagnosis not present

## 2019-10-17 DIAGNOSIS — C61 Malignant neoplasm of prostate: Secondary | ICD-10-CM | POA: Diagnosis not present

## 2019-10-20 ENCOUNTER — Other Ambulatory Visit: Payer: Self-pay

## 2019-10-20 ENCOUNTER — Ambulatory Visit
Admission: RE | Admit: 2019-10-20 | Discharge: 2019-10-20 | Disposition: A | Discharge status: Home / Self Care | Payer: Medicare HMO | Source: Ambulatory Visit | Attending: Radiation Oncology | Admitting: Radiation Oncology

## 2019-10-20 DIAGNOSIS — C61 Malignant neoplasm of prostate: Secondary | ICD-10-CM | POA: Diagnosis not present

## 2019-10-20 DIAGNOSIS — Z510 Encounter for antineoplastic radiation therapy: Secondary | ICD-10-CM | POA: Diagnosis not present

## 2019-10-21 ENCOUNTER — Ambulatory Visit
Admission: RE | Admit: 2019-10-21 | Discharge: 2019-10-21 | Disposition: A | Discharge status: Home / Self Care | Payer: Medicare HMO | Source: Ambulatory Visit | Attending: Radiation Oncology | Admitting: Radiation Oncology

## 2019-10-21 ENCOUNTER — Other Ambulatory Visit: Payer: Self-pay

## 2019-10-21 DIAGNOSIS — Z510 Encounter for antineoplastic radiation therapy: Secondary | ICD-10-CM | POA: Diagnosis not present

## 2019-10-21 DIAGNOSIS — C61 Malignant neoplasm of prostate: Secondary | ICD-10-CM | POA: Diagnosis not present

## 2019-10-22 ENCOUNTER — Other Ambulatory Visit: Payer: Self-pay

## 2019-10-22 ENCOUNTER — Ambulatory Visit
Admission: RE | Admit: 2019-10-22 | Discharge: 2019-10-22 | Disposition: A | Discharge status: Home / Self Care | Payer: Medicare HMO | Source: Ambulatory Visit | Attending: Radiation Oncology | Admitting: Radiation Oncology

## 2019-10-22 DIAGNOSIS — C61 Malignant neoplasm of prostate: Secondary | ICD-10-CM | POA: Diagnosis not present

## 2019-10-22 DIAGNOSIS — Z510 Encounter for antineoplastic radiation therapy: Secondary | ICD-10-CM | POA: Diagnosis not present

## 2019-10-23 ENCOUNTER — Ambulatory Visit
Admission: RE | Admit: 2019-10-23 | Discharge: 2019-10-23 | Disposition: A | Discharge status: Home / Self Care | Payer: Medicare HMO | Source: Ambulatory Visit | Attending: Radiation Oncology | Admitting: Radiation Oncology

## 2019-10-23 ENCOUNTER — Other Ambulatory Visit: Payer: Self-pay

## 2019-10-23 DIAGNOSIS — C61 Malignant neoplasm of prostate: Secondary | ICD-10-CM | POA: Diagnosis not present

## 2019-10-23 DIAGNOSIS — Z510 Encounter for antineoplastic radiation therapy: Secondary | ICD-10-CM | POA: Diagnosis not present

## 2019-10-24 ENCOUNTER — Ambulatory Visit
Admission: RE | Admit: 2019-10-24 | Discharge: 2019-10-24 | Disposition: A | Discharge status: Home / Self Care | Payer: Medicare HMO | Source: Ambulatory Visit | Attending: Radiation Oncology | Admitting: Radiation Oncology

## 2019-10-24 ENCOUNTER — Other Ambulatory Visit: Payer: Self-pay

## 2019-10-24 ENCOUNTER — Encounter: Payer: Self-pay | Admitting: Radiation Oncology

## 2019-10-24 DIAGNOSIS — C61 Malignant neoplasm of prostate: Secondary | ICD-10-CM | POA: Diagnosis not present

## 2019-10-24 DIAGNOSIS — Z510 Encounter for antineoplastic radiation therapy: Secondary | ICD-10-CM | POA: Diagnosis not present

## 2019-11-27 ENCOUNTER — Encounter: Payer: Self-pay | Admitting: Urology

## 2019-11-27 ENCOUNTER — Other Ambulatory Visit: Payer: Self-pay

## 2019-11-27 ENCOUNTER — Ambulatory Visit: Payer: Medicare HMO | Admitting: Urology

## 2019-11-27 ENCOUNTER — Ambulatory Visit
Admission: RE | Admit: 2019-11-27 | Discharge: 2019-11-27 | Disposition: A | Discharge status: Home / Self Care | Payer: Medicare HMO | Source: Ambulatory Visit | Attending: Urology | Admitting: Urology

## 2019-11-27 DIAGNOSIS — C61 Malignant neoplasm of prostate: Secondary | ICD-10-CM

## 2019-11-27 NOTE — Progress Notes (Signed)
Radiation Oncology         [phone] ________________________________  Name: Bob Larson MRN: [id number]  Date: 11/27/2019  DOB: [birth year]  Post Treatment Note  CC: Leanna Battles, MD  Leanna Battles, MD  Diagnosis:   73 y.o. gentleman with stage T3a, locally advanced adenocarcinoma of the prostate with a Gleason's score of 3+4 and a PSA of 12.3  Interval Since Last Radiation:  5 weeks, concurrent with ADT (started 06/23/19)  10/06/19 - 10/24/19:  The prostate and pelvic nodes were treated to 45 Gy in 25 fractions of 1.8 Gy, followed by a boost to the prostate to a total dose of 75 Gy with 15 additional fractions of 2.0 Gy.   Narrative:  I spoke with the patient to conduct his routine scheduled 1 month follow up visit via telephone to spare the patient unnecessary potential exposure in the healthcare setting during the current COVID-19 pandemic.  The patient was notified in advance and gave permission to proceed with this visit format. He tolerated radiation treatments relatively well with only mild urinary symptoms and modest fatigue.  He dd experience intermittency and occasional feelings of incomplete bladder emptying but denied dysuria, gross hematuria or incontinence. He denied abdominal pain or bowel symptoms.                             On review of systems, the patient states that he is doing well in general and noticing slow but gradual improvement in the LUTS.  He continues with intermittency, weak flow of stream, incomplete bladder emptying, increased urinary frequency, urgency and nocturia.  He denies dysuria or gross hematuria.  He reports a healthy appetite and is maintaining his weight.  He continues with moderate fatigue associated with the ADT but in general, tolerates this well.  He denies abdominal pain, nausea, vomiting, diarrhea or constipation.  Overall, he is quite pleased with his progress to date.  ALLERGIES:  is allergic to penicillins.  Meds: Current  Outpatient Medications  Medication Sig Dispense Refill  . clonazePAM (KLONOPIN) 1 MG tablet Take 1/2 to 1 tablet by mouth 3 times daily as needed (Patient taking differently: Take 1 mg by mouth 3 (three) times daily as needed for anxiety. ) 90 tablet 1  . Multiple Vitamin (MULTIVITAMIN) capsule Take 1 capsule by mouth daily.    . budesonide (ENTOCORT EC) 3 MG 24 hr capsule Take 9 mg by mouth as needed.     No current facility-administered medications for this encounter.    Physical Findings:  vitals were not taken for this visit.   /Unable to assess due to telephone follow-up visit format.  Lab Findings: Lab Results  Component Value Date   WBC 10.4 08/18/2019   HGB 14.0 08/18/2019   HCT 42.4 08/18/2019   MCV 86.5 08/18/2019   PLT 175 08/18/2019     Radiographic Findings: No results found.  Impression/Plan: 1. 73 y.o. gentleman with stage T3a, locally advanced adenocarcinoma of the prostate with a Gleason's score of 3+4 and a PSA of 12.3. He will continue to follow up with urology for ongoing PSA determinations.  He does not currently have an appointment scheduled for follow-up with Dr. Alinda Money to his knowledge but was advised to anticipate a visit for repeat PSA around February or March 2021. He understands what to expect with regards to PSA monitoring going forward. I will look forward to following his response to treatment via correspondence with urology,  and would be happy to continue to participate in his care if clinically indicated. I talked to the patient about what to expect in the future, including his risk for erectile dysfunction and rectal bleeding. I encouraged him to call or return to the office if he has any questions regarding his previous radiation or possible radiation side effects. He was comfortable with this plan and will follow up as needed.  2.  Cancer screening:  Due to his history and his age, he should receive screening for skin cancers, prostate cancer, lung  cancer, and colon cancers.  The information and recommendations are listed on the patient's comprehensive care plan/treatment summary and will be mailed to the patient for his reference.   A copy of today's visit as well as the detailed survivorship care plan will also be shared with patient's PCP so that transfer of care, with specific responsibilities can be identified.   3.  Health maintenance and wellness promotion: He is encouraged to consume 5-7 servings of fruits and vegetables per day. A copy of the "Nutrition [employer]" handout, as well as the handout "Take Control of Your Health and Reduce Your Cancer Risk" from the [employer] are included in the survivorship care plan packet that he will recieve.  He is also encouraged to engage in moderate to vigorous exercise for 30 minutes per day most days of the week. Within the packet, there is information regarding the [employer] fitness program, which is designed for cancer survivors to help them become more physically fit after cancer treatments. A healthy BMI is 18.5-24.9 and maintaining a healthy weight reduces risk of cancer recurrences.  He is instructed to limit his alcohol consumption and continue to abstain from tobacco use. Lastly, he is encouraged to use sunscreen and wear protective clothing when in the sun.       Nicholos Johns, PA-C

## 2019-11-28 NOTE — Progress Notes (Signed)
  Radiation Oncology         ([phone]) [phone] ________________________________  Name: Bob Larson MRN: [id number]  Date: 10/24/2019  DOB: [birth year]  End of Treatment Note  Diagnosis:   73 y.o. gentleman with stage T3a, locally advanced adenocarcinoma of the prostate with a Gleason's score of 3+4 and a PSA of 12.3     Indication for treatment:  Curative, Definitive Radiotherapy in combination with ADT  Radiation treatment dates:   10/06/2019 - 10/24/2019  Site/dose:  1. The prostate, seminal vesicles, and pelvic lymph nodes were initially treated to 45 Gy in 25 fractions of 1.8 Gy  2. The prostate only was boosted to 75 Gy with 15 additional fractions of 2.0 Gy   Beams/energy:  1. The prostate, seminal vesicles, and pelvic lymph nodes were initially treated using helical intensity modulated radiotherapy delivering 6 megavolt photons. Image guidance was performed with megavoltage CT studies prior to each fraction. He was immobilized with a body fix lower extremity mold.  2. the prostate only was boosted using helical intensity modulated radiotherapy delivering 6 megavolt photons. Image guidance was performed with megavoltage CT studies prior to each fraction. He was immobilized with a body fix lower extremity mold.  Narrative: The patient tolerated radiation treatment relatively well. He reported nocturia x2, diarrhea relieved by imodium, some incomplete bladder emptying, and mild fatigue. He denied dysuria or hematuria throughout treatment.  Plan: The patient has completed radiation treatment. He will return to radiation oncology clinic for routine followup in one month. I advised him to call or return sooner if he has any questions or concerns related to his recovery or treatment. ________________________________  Sheral Apley. Tammi Klippel, M.D.  This document serves as a record of services personally performed by Tyler Pita, MD. It was created on his behalf by Wilburn Mylar, a  trained medical scribe. The creation of this record is based on the scribe's personal observations and the provider's statements to them. This document has been checked and approved by the attending provider.

## 2019-12-15 DIAGNOSIS — R69 Illness, unspecified: Secondary | ICD-10-CM | POA: Diagnosis not present

## 2019-12-26 DIAGNOSIS — C61 Malignant neoplasm of prostate: Secondary | ICD-10-CM | POA: Diagnosis not present

## 2020-01-07 ENCOUNTER — Telehealth: Payer: Self-pay | Admitting: Emergency Medicine

## 2020-01-20 DIAGNOSIS — H52203 Unspecified astigmatism, bilateral: Secondary | ICD-10-CM | POA: Diagnosis not present

## 2020-01-20 DIAGNOSIS — H524 Presbyopia: Secondary | ICD-10-CM | POA: Diagnosis not present

## 2020-01-20 DIAGNOSIS — H5213 Myopia, bilateral: Secondary | ICD-10-CM | POA: Diagnosis not present

## 2020-01-30 ENCOUNTER — Telehealth: Payer: Self-pay | Admitting: Medical Oncology

## 2020-01-30 NOTE — Telephone Encounter (Signed)
Returned call to patient, to discuss post radiation symptoms. He completed radiation to the prostate, 10/24/19 and followed up with Ashlyn, PA, 11/27/19. He was complaining of urinary urgency, weak stream, not emptying his bladder, nocturia and pain in tip of penis. He states Ashlyn felt he was having bladder spasms and told him these symptoms would continue to improve over the next few months. He states it has been 3 months and he is not any better. He was up every 2 hours to urinate last night. He is currently not taking any medications to help with symptoms. I asked if he followed up with Dr. Alinda Money, 01/02/20. He did get his PSA drawn but he cancelled the appointment with Dr. Alinda Money, because he does not want to continue ADT.  I encouraged him to call Dr.Borden's office to get an appointment to be evaluated. I stressed the importance of keeping appointments, not only for PSA but to follow up with the physician. He voiced understanding of the above.

## 2020-02-05 DIAGNOSIS — Z0100 Encounter for examination of eyes and vision without abnormal findings: Secondary | ICD-10-CM | POA: Diagnosis not present

## 2020-04-08 DIAGNOSIS — R69 Illness, unspecified: Secondary | ICD-10-CM | POA: Diagnosis not present

## 2020-04-16 DIAGNOSIS — R69 Illness, unspecified: Secondary | ICD-10-CM | POA: Diagnosis not present

## 2020-05-04 DIAGNOSIS — Z125 Encounter for screening for malignant neoplasm of prostate: Secondary | ICD-10-CM | POA: Diagnosis not present

## 2020-05-04 DIAGNOSIS — E7849 Other hyperlipidemia: Secondary | ICD-10-CM | POA: Diagnosis not present

## 2020-05-04 DIAGNOSIS — Z0000 Encounter for general adult medical examination without abnormal findings: Secondary | ICD-10-CM | POA: Diagnosis not present

## 2020-05-11 DIAGNOSIS — Z1331 Encounter for screening for depression: Secondary | ICD-10-CM | POA: Diagnosis not present

## 2020-05-11 DIAGNOSIS — R69 Illness, unspecified: Secondary | ICD-10-CM | POA: Diagnosis not present

## 2020-05-11 DIAGNOSIS — R430 Anosmia: Secondary | ICD-10-CM | POA: Diagnosis not present

## 2020-05-11 DIAGNOSIS — R82998 Other abnormal findings in urine: Secondary | ICD-10-CM | POA: Diagnosis not present

## 2020-05-11 DIAGNOSIS — Z0000 Encounter for general adult medical examination without abnormal findings: Secondary | ICD-10-CM | POA: Diagnosis not present

## 2020-05-11 DIAGNOSIS — C61 Malignant neoplasm of prostate: Secondary | ICD-10-CM | POA: Diagnosis not present

## 2020-05-11 DIAGNOSIS — E7849 Other hyperlipidemia: Secondary | ICD-10-CM | POA: Diagnosis not present

## 2020-05-13 DIAGNOSIS — Z1212 Encounter for screening for malignant neoplasm of rectum: Secondary | ICD-10-CM | POA: Diagnosis not present

## 2020-07-19 DIAGNOSIS — R338 Other retention of urine: Secondary | ICD-10-CM | POA: Diagnosis not present

## 2020-07-19 DIAGNOSIS — C61 Malignant neoplasm of prostate: Secondary | ICD-10-CM | POA: Diagnosis not present

## 2020-07-26 DIAGNOSIS — C61 Malignant neoplasm of prostate: Secondary | ICD-10-CM | POA: Diagnosis not present

## 2020-07-26 DIAGNOSIS — R338 Other retention of urine: Secondary | ICD-10-CM | POA: Diagnosis not present

## 2020-08-24 DIAGNOSIS — C61 Malignant neoplasm of prostate: Secondary | ICD-10-CM | POA: Diagnosis not present

## 2020-08-24 DIAGNOSIS — R8279 Other abnormal findings on microbiological examination of urine: Secondary | ICD-10-CM | POA: Diagnosis not present

## 2020-08-24 DIAGNOSIS — N401 Enlarged prostate with lower urinary tract symptoms: Secondary | ICD-10-CM | POA: Diagnosis not present

## 2020-08-24 DIAGNOSIS — R3914 Feeling of incomplete bladder emptying: Secondary | ICD-10-CM | POA: Diagnosis not present

## 2020-12-23 DIAGNOSIS — H9313 Tinnitus, bilateral: Secondary | ICD-10-CM | POA: Diagnosis not present

## 2020-12-23 DIAGNOSIS — H903 Sensorineural hearing loss, bilateral: Secondary | ICD-10-CM | POA: Diagnosis not present

## 2020-12-31 DIAGNOSIS — C44519 Basal cell carcinoma of skin of other part of trunk: Secondary | ICD-10-CM | POA: Diagnosis not present

## 2020-12-31 DIAGNOSIS — C44311 Basal cell carcinoma of skin of nose: Secondary | ICD-10-CM | POA: Diagnosis not present

## 2020-12-31 DIAGNOSIS — D485 Neoplasm of uncertain behavior of skin: Secondary | ICD-10-CM | POA: Diagnosis not present

## 2020-12-31 DIAGNOSIS — L570 Actinic keratosis: Secondary | ICD-10-CM | POA: Diagnosis not present

## 2020-12-31 DIAGNOSIS — X32XXXD Exposure to sunlight, subsequent encounter: Secondary | ICD-10-CM | POA: Diagnosis not present

## 2020-12-31 DIAGNOSIS — L821 Other seborrheic keratosis: Secondary | ICD-10-CM | POA: Diagnosis not present

## 2020-12-31 DIAGNOSIS — D225 Melanocytic nevi of trunk: Secondary | ICD-10-CM | POA: Diagnosis not present

## 2021-01-20 DIAGNOSIS — H524 Presbyopia: Secondary | ICD-10-CM | POA: Diagnosis not present

## 2021-01-20 DIAGNOSIS — Z961 Presence of intraocular lens: Secondary | ICD-10-CM | POA: Diagnosis not present

## 2021-01-20 DIAGNOSIS — H5213 Myopia, bilateral: Secondary | ICD-10-CM | POA: Diagnosis not present

## 2021-01-20 DIAGNOSIS — H52203 Unspecified astigmatism, bilateral: Secondary | ICD-10-CM | POA: Diagnosis not present

## 2021-01-25 DIAGNOSIS — M47812 Spondylosis without myelopathy or radiculopathy, cervical region: Secondary | ICD-10-CM | POA: Diagnosis not present

## 2021-01-25 DIAGNOSIS — M542 Cervicalgia: Secondary | ICD-10-CM | POA: Diagnosis not present

## 2021-01-28 DIAGNOSIS — Z08 Encounter for follow-up examination after completed treatment for malignant neoplasm: Secondary | ICD-10-CM | POA: Diagnosis not present

## 2021-01-28 DIAGNOSIS — Z85828 Personal history of other malignant neoplasm of skin: Secondary | ICD-10-CM | POA: Diagnosis not present

## 2021-02-24 DIAGNOSIS — C61 Malignant neoplasm of prostate: Secondary | ICD-10-CM | POA: Diagnosis not present

## 2021-03-04 DIAGNOSIS — R3914 Feeling of incomplete bladder emptying: Secondary | ICD-10-CM | POA: Diagnosis not present

## 2021-03-04 DIAGNOSIS — N401 Enlarged prostate with lower urinary tract symptoms: Secondary | ICD-10-CM | POA: Diagnosis not present

## 2021-03-04 DIAGNOSIS — C61 Malignant neoplasm of prostate: Secondary | ICD-10-CM | POA: Diagnosis not present

## 2021-03-04 DIAGNOSIS — R8271 Bacteriuria: Secondary | ICD-10-CM | POA: Diagnosis not present

## 2021-05-13 DIAGNOSIS — Z85828 Personal history of other malignant neoplasm of skin: Secondary | ICD-10-CM | POA: Diagnosis not present

## 2021-05-13 DIAGNOSIS — Z08 Encounter for follow-up examination after completed treatment for malignant neoplasm: Secondary | ICD-10-CM | POA: Diagnosis not present

## 2021-05-13 DIAGNOSIS — B351 Tinea unguium: Secondary | ICD-10-CM | POA: Diagnosis not present

## 2021-05-13 DIAGNOSIS — L603 Nail dystrophy: Secondary | ICD-10-CM | POA: Diagnosis not present

## 2021-05-19 DIAGNOSIS — Z125 Encounter for screening for malignant neoplasm of prostate: Secondary | ICD-10-CM | POA: Diagnosis not present

## 2021-05-19 DIAGNOSIS — E785 Hyperlipidemia, unspecified: Secondary | ICD-10-CM | POA: Diagnosis not present

## 2021-05-26 DIAGNOSIS — E785 Hyperlipidemia, unspecified: Secondary | ICD-10-CM | POA: Diagnosis not present

## 2021-05-26 DIAGNOSIS — Z1331 Encounter for screening for depression: Secondary | ICD-10-CM | POA: Diagnosis not present

## 2021-05-26 DIAGNOSIS — Z0000 Encounter for general adult medical examination without abnormal findings: Secondary | ICD-10-CM | POA: Diagnosis not present

## 2021-05-26 DIAGNOSIS — Z1212 Encounter for screening for malignant neoplasm of rectum: Secondary | ICD-10-CM | POA: Diagnosis not present

## 2021-05-26 DIAGNOSIS — Z8546 Personal history of malignant neoplasm of prostate: Secondary | ICD-10-CM | POA: Diagnosis not present

## 2021-05-26 DIAGNOSIS — R69 Illness, unspecified: Secondary | ICD-10-CM | POA: Diagnosis not present

## 2021-05-26 DIAGNOSIS — M545 Low back pain, unspecified: Secondary | ICD-10-CM | POA: Diagnosis not present

## 2021-05-26 DIAGNOSIS — Z1339 Encounter for screening examination for other mental health and behavioral disorders: Secondary | ICD-10-CM | POA: Diagnosis not present

## 2021-05-26 DIAGNOSIS — R82998 Other abnormal findings in urine: Secondary | ICD-10-CM | POA: Diagnosis not present

## 2021-10-11 DIAGNOSIS — X32XXXD Exposure to sunlight, subsequent encounter: Secondary | ICD-10-CM | POA: Diagnosis not present

## 2021-10-11 DIAGNOSIS — C4441 Basal cell carcinoma of skin of scalp and neck: Secondary | ICD-10-CM | POA: Diagnosis not present

## 2021-10-11 DIAGNOSIS — L570 Actinic keratosis: Secondary | ICD-10-CM | POA: Diagnosis not present

## 2021-10-11 DIAGNOSIS — L820 Inflamed seborrheic keratosis: Secondary | ICD-10-CM | POA: Diagnosis not present

## 2021-10-11 DIAGNOSIS — B351 Tinea unguium: Secondary | ICD-10-CM | POA: Diagnosis not present

## 2021-11-02 DIAGNOSIS — C61 Malignant neoplasm of prostate: Secondary | ICD-10-CM | POA: Diagnosis not present

## 2021-11-09 DIAGNOSIS — R3914 Feeling of incomplete bladder emptying: Secondary | ICD-10-CM | POA: Diagnosis not present

## 2021-11-09 DIAGNOSIS — C61 Malignant neoplasm of prostate: Secondary | ICD-10-CM | POA: Diagnosis not present

## 2021-11-09 DIAGNOSIS — N401 Enlarged prostate with lower urinary tract symptoms: Secondary | ICD-10-CM | POA: Diagnosis not present

## 2021-11-11 DIAGNOSIS — Z85828 Personal history of other malignant neoplasm of skin: Secondary | ICD-10-CM | POA: Diagnosis not present

## 2021-11-11 DIAGNOSIS — Z08 Encounter for follow-up examination after completed treatment for malignant neoplasm: Secondary | ICD-10-CM | POA: Diagnosis not present

## 2022-01-24 DIAGNOSIS — H52203 Unspecified astigmatism, bilateral: Secondary | ICD-10-CM | POA: Diagnosis not present

## 2022-01-24 DIAGNOSIS — H524 Presbyopia: Secondary | ICD-10-CM | POA: Diagnosis not present

## 2022-01-24 DIAGNOSIS — Z961 Presence of intraocular lens: Secondary | ICD-10-CM | POA: Diagnosis not present

## 2022-01-24 DIAGNOSIS — H5213 Myopia, bilateral: Secondary | ICD-10-CM | POA: Diagnosis not present

## 2022-04-05 DIAGNOSIS — Z0100 Encounter for examination of eyes and vision without abnormal findings: Secondary | ICD-10-CM | POA: Diagnosis not present

## 2022-05-02 DIAGNOSIS — L570 Actinic keratosis: Secondary | ICD-10-CM | POA: Diagnosis not present

## 2022-05-02 DIAGNOSIS — D225 Melanocytic nevi of trunk: Secondary | ICD-10-CM | POA: Diagnosis not present

## 2022-05-02 DIAGNOSIS — L218 Other seborrheic dermatitis: Secondary | ICD-10-CM | POA: Diagnosis not present

## 2022-05-02 DIAGNOSIS — X32XXXD Exposure to sunlight, subsequent encounter: Secondary | ICD-10-CM | POA: Diagnosis not present

## 2022-05-02 DIAGNOSIS — C44519 Basal cell carcinoma of skin of other part of trunk: Secondary | ICD-10-CM | POA: Diagnosis not present

## 2022-05-30 DIAGNOSIS — X32XXXD Exposure to sunlight, subsequent encounter: Secondary | ICD-10-CM | POA: Diagnosis not present

## 2022-05-30 DIAGNOSIS — Z85828 Personal history of other malignant neoplasm of skin: Secondary | ICD-10-CM | POA: Diagnosis not present

## 2022-05-30 DIAGNOSIS — Z08 Encounter for follow-up examination after completed treatment for malignant neoplasm: Secondary | ICD-10-CM | POA: Diagnosis not present

## 2022-05-30 DIAGNOSIS — L570 Actinic keratosis: Secondary | ICD-10-CM | POA: Diagnosis not present

## 2022-06-01 DIAGNOSIS — R7989 Other specified abnormal findings of blood chemistry: Secondary | ICD-10-CM | POA: Diagnosis not present

## 2022-06-01 DIAGNOSIS — E785 Hyperlipidemia, unspecified: Secondary | ICD-10-CM | POA: Diagnosis not present

## 2022-06-01 DIAGNOSIS — Z0000 Encounter for general adult medical examination without abnormal findings: Secondary | ICD-10-CM | POA: Diagnosis not present

## 2022-06-01 DIAGNOSIS — Z125 Encounter for screening for malignant neoplasm of prostate: Secondary | ICD-10-CM | POA: Diagnosis not present

## 2022-06-08 DIAGNOSIS — F419 Anxiety disorder, unspecified: Secondary | ICD-10-CM | POA: Diagnosis not present

## 2022-06-08 DIAGNOSIS — Z1339 Encounter for screening examination for other mental health and behavioral disorders: Secondary | ICD-10-CM | POA: Diagnosis not present

## 2022-06-08 DIAGNOSIS — Z0000 Encounter for general adult medical examination without abnormal findings: Secondary | ICD-10-CM | POA: Diagnosis not present

## 2022-06-08 DIAGNOSIS — R82998 Other abnormal findings in urine: Secondary | ICD-10-CM | POA: Diagnosis not present

## 2022-06-08 DIAGNOSIS — Z1331 Encounter for screening for depression: Secondary | ICD-10-CM | POA: Diagnosis not present

## 2022-06-08 DIAGNOSIS — E785 Hyperlipidemia, unspecified: Secondary | ICD-10-CM | POA: Diagnosis not present

## 2022-06-08 DIAGNOSIS — C61 Malignant neoplasm of prostate: Secondary | ICD-10-CM | POA: Diagnosis not present

## 2022-06-08 DIAGNOSIS — R69 Illness, unspecified: Secondary | ICD-10-CM | POA: Diagnosis not present

## 2022-06-08 DIAGNOSIS — M47812 Spondylosis without myelopathy or radiculopathy, cervical region: Secondary | ICD-10-CM | POA: Diagnosis not present

## 2022-10-05 DIAGNOSIS — H6122 Impacted cerumen, left ear: Secondary | ICD-10-CM | POA: Diagnosis not present

## 2022-10-05 DIAGNOSIS — H6121 Impacted cerumen, right ear: Secondary | ICD-10-CM | POA: Diagnosis not present

## 2022-10-05 DIAGNOSIS — H9212 Otorrhea, left ear: Secondary | ICD-10-CM | POA: Diagnosis not present

## 2022-11-01 DIAGNOSIS — C61 Malignant neoplasm of prostate: Secondary | ICD-10-CM | POA: Diagnosis not present

## 2022-11-08 DIAGNOSIS — C61 Malignant neoplasm of prostate: Secondary | ICD-10-CM | POA: Diagnosis not present

## 2023-04-13 DIAGNOSIS — K52831 Collagenous colitis: Secondary | ICD-10-CM | POA: Diagnosis not present

## 2023-04-13 DIAGNOSIS — R194 Change in bowel habit: Secondary | ICD-10-CM | POA: Diagnosis not present

## 2023-04-18 DIAGNOSIS — X32XXXD Exposure to sunlight, subsequent encounter: Secondary | ICD-10-CM | POA: Diagnosis not present

## 2023-04-18 DIAGNOSIS — L570 Actinic keratosis: Secondary | ICD-10-CM | POA: Diagnosis not present

## 2023-04-18 DIAGNOSIS — D225 Melanocytic nevi of trunk: Secondary | ICD-10-CM | POA: Diagnosis not present

## 2023-04-18 DIAGNOSIS — L820 Inflamed seborrheic keratosis: Secondary | ICD-10-CM | POA: Diagnosis not present

## 2023-04-18 DIAGNOSIS — B351 Tinea unguium: Secondary | ICD-10-CM | POA: Diagnosis not present

## 2023-04-18 DIAGNOSIS — L821 Other seborrheic keratosis: Secondary | ICD-10-CM | POA: Diagnosis not present

## 2023-05-22 DIAGNOSIS — H52203 Unspecified astigmatism, bilateral: Secondary | ICD-10-CM | POA: Diagnosis not present

## 2023-05-22 DIAGNOSIS — H524 Presbyopia: Secondary | ICD-10-CM | POA: Diagnosis not present

## 2023-05-22 DIAGNOSIS — H5213 Myopia, bilateral: Secondary | ICD-10-CM | POA: Diagnosis not present

## 2023-07-18 DIAGNOSIS — K52831 Collagenous colitis: Secondary | ICD-10-CM | POA: Diagnosis not present

## 2023-07-18 DIAGNOSIS — R152 Fecal urgency: Secondary | ICD-10-CM | POA: Diagnosis not present

## 2023-07-18 DIAGNOSIS — R197 Diarrhea, unspecified: Secondary | ICD-10-CM | POA: Diagnosis not present

## 2023-07-23 DIAGNOSIS — S92154A Nondisplaced avulsion fracture (chip fracture) of right talus, initial encounter for closed fracture: Secondary | ICD-10-CM | POA: Diagnosis not present

## 2023-07-23 DIAGNOSIS — M79671 Pain in right foot: Secondary | ICD-10-CM | POA: Diagnosis not present

## 2023-07-23 DIAGNOSIS — S99921A Unspecified injury of right foot, initial encounter: Secondary | ICD-10-CM | POA: Diagnosis not present

## 2023-08-06 DIAGNOSIS — M79671 Pain in right foot: Secondary | ICD-10-CM | POA: Diagnosis not present

## 2023-08-12 DIAGNOSIS — R197 Diarrhea, unspecified: Secondary | ICD-10-CM | POA: Diagnosis not present

## 2023-08-14 DIAGNOSIS — Z1212 Encounter for screening for malignant neoplasm of rectum: Secondary | ICD-10-CM | POA: Diagnosis not present

## 2023-08-14 DIAGNOSIS — E785 Hyperlipidemia, unspecified: Secondary | ICD-10-CM | POA: Diagnosis not present

## 2023-08-14 DIAGNOSIS — C61 Malignant neoplasm of prostate: Secondary | ICD-10-CM | POA: Diagnosis not present

## 2023-08-20 DIAGNOSIS — R430 Anosmia: Secondary | ICD-10-CM | POA: Diagnosis not present

## 2023-08-20 DIAGNOSIS — F419 Anxiety disorder, unspecified: Secondary | ICD-10-CM | POA: Diagnosis not present

## 2023-08-20 DIAGNOSIS — E785 Hyperlipidemia, unspecified: Secondary | ICD-10-CM | POA: Diagnosis not present

## 2023-08-20 DIAGNOSIS — R82998 Other abnormal findings in urine: Secondary | ICD-10-CM | POA: Diagnosis not present

## 2023-08-20 DIAGNOSIS — C61 Malignant neoplasm of prostate: Secondary | ICD-10-CM | POA: Diagnosis not present

## 2023-08-20 DIAGNOSIS — Z0000 Encounter for general adult medical examination without abnormal findings: Secondary | ICD-10-CM | POA: Diagnosis not present

## 2023-08-20 DIAGNOSIS — K52831 Collagenous colitis: Secondary | ICD-10-CM | POA: Diagnosis not present

## 2023-09-05 DIAGNOSIS — Z0100 Encounter for examination of eyes and vision without abnormal findings: Secondary | ICD-10-CM | POA: Diagnosis not present

## 2024-04-14 DIAGNOSIS — C61 Malignant neoplasm of prostate: Secondary | ICD-10-CM | POA: Diagnosis not present

## 2024-05-27 DIAGNOSIS — Z961 Presence of intraocular lens: Secondary | ICD-10-CM | POA: Diagnosis not present

## 2024-05-27 DIAGNOSIS — H353112 Nonexudative age-related macular degeneration, right eye, intermediate dry stage: Secondary | ICD-10-CM | POA: Diagnosis not present

## 2024-05-27 DIAGNOSIS — H353121 Nonexudative age-related macular degeneration, left eye, early dry stage: Secondary | ICD-10-CM | POA: Diagnosis not present

## 2024-05-27 DIAGNOSIS — H35372 Puckering of macula, left eye: Secondary | ICD-10-CM | POA: Diagnosis not present

## 2024-05-27 DIAGNOSIS — H43811 Vitreous degeneration, right eye: Secondary | ICD-10-CM | POA: Diagnosis not present

## 2024-05-30 DIAGNOSIS — L570 Actinic keratosis: Secondary | ICD-10-CM | POA: Diagnosis not present

## 2024-05-30 DIAGNOSIS — X32XXXD Exposure to sunlight, subsequent encounter: Secondary | ICD-10-CM | POA: Diagnosis not present

## 2024-05-30 DIAGNOSIS — B351 Tinea unguium: Secondary | ICD-10-CM | POA: Diagnosis not present

## 2024-08-25 DIAGNOSIS — E785 Hyperlipidemia, unspecified: Secondary | ICD-10-CM | POA: Diagnosis not present

## 2024-08-25 DIAGNOSIS — C61 Malignant neoplasm of prostate: Secondary | ICD-10-CM | POA: Diagnosis not present

## 2024-09-01 DIAGNOSIS — Z1389 Encounter for screening for other disorder: Secondary | ICD-10-CM | POA: Diagnosis not present

## 2024-09-23 DIAGNOSIS — R030 Elevated blood-pressure reading, without diagnosis of hypertension: Secondary | ICD-10-CM | POA: Diagnosis not present

## 2024-09-23 DIAGNOSIS — L237 Allergic contact dermatitis due to plants, except food: Secondary | ICD-10-CM | POA: Diagnosis not present

## 2024-09-23 DIAGNOSIS — L282 Other prurigo: Secondary | ICD-10-CM | POA: Diagnosis not present

## 2024-10-29 DIAGNOSIS — C61 Malignant neoplasm of prostate: Secondary | ICD-10-CM | POA: Diagnosis not present

## 2024-10-29 DIAGNOSIS — R3912 Poor urinary stream: Secondary | ICD-10-CM | POA: Diagnosis not present

## 2024-10-29 DIAGNOSIS — N401 Enlarged prostate with lower urinary tract symptoms: Secondary | ICD-10-CM | POA: Diagnosis not present
